# Patient Record
Sex: Male | Born: 1995 | Race: White | Hispanic: No
Health system: Southern US, Community
[De-identification: ages and names within clinical notes are randomized; demographics above are authoritative.]

## PROBLEM LIST (undated history)

## (undated) DIAGNOSIS — B001 Herpesviral vesicular dermatitis: Secondary | ICD-10-CM

## (undated) DIAGNOSIS — R51 Headache: Secondary | ICD-10-CM

## (undated) DIAGNOSIS — R030 Elevated blood-pressure reading, without diagnosis of hypertension: Secondary | ICD-10-CM

## (undated) DIAGNOSIS — R519 Headache, unspecified: Secondary | ICD-10-CM

## (undated) DIAGNOSIS — G43909 Migraine, unspecified, not intractable, without status migrainosus: Secondary | ICD-10-CM

## (undated) DIAGNOSIS — I10 Essential (primary) hypertension: Secondary | ICD-10-CM

## (undated) HISTORY — DX: Herpesviral vesicular dermatitis: B00.1

## (undated) HISTORY — DX: Headache: R51

## (undated) HISTORY — DX: Headache, unspecified: R51.9

## (undated) HISTORY — DX: Migraine, unspecified, not intractable, without status migrainosus: G43.909

## (undated) HISTORY — DX: Elevated blood-pressure reading, without diagnosis of hypertension: R03.0

---
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
## 2015-01-30 ENCOUNTER — Emergency Department: Payer: Self-pay | Admitting: Emergency Medicine

## 2015-05-19 ENCOUNTER — Encounter (INDEPENDENT_AMBULATORY_CARE_PROVIDER_SITE_OTHER): Payer: Self-pay

## 2015-05-19 ENCOUNTER — Ambulatory Visit (INDEPENDENT_AMBULATORY_CARE_PROVIDER_SITE_OTHER): Payer: 59 | Admitting: Primary Care

## 2015-05-19 ENCOUNTER — Encounter: Payer: Self-pay | Admitting: Primary Care

## 2015-05-19 VITALS — BP 128/76 | HR 94 | Temp 98.0°F | Ht 72.0 in | Wt 243.1 lb

## 2015-05-19 DIAGNOSIS — G43001 Migraine without aura, not intractable, with status migrainosus: Secondary | ICD-10-CM

## 2015-05-19 DIAGNOSIS — G43019 Migraine without aura, intractable, without status migrainosus: Secondary | ICD-10-CM | POA: Insufficient documentation

## 2015-05-19 MED ORDER — SUMATRIPTAN SUCCINATE 50 MG PO TABS: ORAL_TABLET | ORAL | Status: DC

## 2015-05-19 MED ORDER — TOPIRAMATE 25 MG PO TABS
25.0000 mg | ORAL_TABLET | Freq: Every day | ORAL | Status: DC
Start: 2015-05-19 — End: 2015-09-13

## 2015-05-19 NOTE — Progress Notes (Signed)
   Subjective:    Patient ID: Timothy Proctor, male    DOB: 02/02/1996, 19 y.o.   MRN: [id number]  HPI  [name] is an 19 year old male who presents today to establish care and discuss the problems mentioned below.   1) Migraines: Headaches present since childhood and will get them daily to every other day with varying intensity. He will experience nausea and photophobia during migraine attacks which occur three times weekly. His migraines will typically occur to his forehead. He will frequently have to call out of work due to headaches. He was once on treatment as a child but cannot remember the medication. He will typically take several Aleve tablets and sleep which will help daily headaches.  Review of Systems  Constitutional: Negative for fatigue and unexpected weight change.  HENT: Negative for rhinorrhea.   Respiratory: Negative for cough and shortness of breath.   Cardiovascular: Negative for chest pain.  Gastrointestinal: Negative for diarrhea and constipation.  Genitourinary: Negative for dysuria and frequency.  Musculoskeletal: Negative for myalgias and arthralgias.  Skin: Negative for rash.  Neurological: Positive for headaches. Negative for dizziness and numbness.  Psychiatric/Behavioral:       Denies concerns for anxiety or depression.       Past Medical History  Diagnosis Date  . Frequent headaches   . Elevated blood pressure (not hypertension)   . Migraine     History   Social History  . Marital Status: Single    Spouse Name: N/A  . Number of Children: N/A  . Years of Education: N/A   Occupational History  . Not on file.   Social History Main Topics  . Smoking status: Former Games developermoker  . Smokeless tobacco: Former NeurosurgeonUser    Quit date: 03/29/2015  . Alcohol Use: No  . Drug Use: Not on file  . Sexual Activity: Not on file   Other Topics Concern  . Not on file   Social History Narrative   Single.   [place] [employer]   Highest level of education  completed was 10th grade.   Enjoys spending time with his girlfriend, playing basketball, exercising.    History reviewed. No pertinent past surgical history.  Family History  Problem Relation Age of Onset  . Hypertension Father   . Cancer Maternal Grandmother     lung  . Hypertension Mother     No Known Allergies  No current outpatient prescriptions on file prior to visit.   No current facility-administered medications on file prior to visit.    BP 128/76 mmHg  Pulse 94  Temp(Src) 98 F (36.7 C) (Oral)  Ht 6' (1.829 m)  Wt 243 lb 1.9 oz (110.279 kg)  BMI 32.97 kg/m2  SpO2 98%    Objective:   Physical Exam  Constitutional: He is oriented to person, place, and time. He appears well-nourished.  HENT:  Head: Normocephalic.  Eyes: EOM are normal. Pupils are equal, round, and reactive to light.  Cardiovascular: Normal rate and regular rhythm.   Pulmonary/Chest: Effort normal and breath sounds normal.  Musculoskeletal: Normal range of motion.  Neurological: He is alert and oriented to person, place, and time. He has normal reflexes. No cranial nerve deficit.  Skin: Skin is warm and dry.          Assessment & Plan:

## 2015-05-19 NOTE — Patient Instructions (Signed)
Start Topamax tablets. Take 1 tablet by mouth at bedtime every day for migraine prevention.  You may use the Sumatriptan as needed for severe migraines. Take 1 tablet at onset of migraine. May take the second tablet if no improvement in migraine in 2 hours. Do not exceed 2 tablets in 24 hours. This medication will make you drowsy.  It was a pleasure to meet you today! Please don't hesitate to call me with any questions. Welcome to [facility]!  Follow up in 3-4 weeks for evaluation of migraines.  Migraine Headache A migraine headache is an intense, throbbing pain on one or both sides of your head. A migraine can last for 30 minutes to several hours. CAUSES  The exact cause of a migraine headache is not always known. However, a migraine may be caused when nerves in the brain become irritated and release chemicals that cause inflammation. This causes pain. Certain things may also trigger migraines, such as:  Alcohol.  Smoking.  Stress.  Menstruation.  Aged cheeses.  Foods or drinks that contain nitrates, glutamate, aspartame, or tyramine.  Lack of sleep.  Chocolate.  Caffeine.  Hunger.  Physical exertion.  Fatigue.  Medicines used to treat chest pain (nitroglycerine), birth control pills, estrogen, and some blood pressure medicines. SIGNS AND SYMPTOMS  Pain on one or both sides of your head.  Pulsating or throbbing pain.  Severe pain that prevents daily activities.  Pain that is aggravated by any physical activity.  Nausea, vomiting, or both.  Dizziness.  Pain with exposure to bright lights, loud noises, or activity.  General sensitivity to bright lights, loud noises, or smells. Before you get a migraine, you may get warning signs that a migraine is coming (aura). An aura may include:  Seeing flashing lights.  Seeing bright spots, halos, or zigzag lines.  Having tunnel vision or blurred vision.  Having feelings of numbness or tingling.  Having trouble  talking.  Having muscle weakness. DIAGNOSIS  A migraine headache is often diagnosed based on:  Symptoms.  Physical exam.  A CT scan or MRI of your head. These imaging tests cannot diagnose migraines, but they can help rule out other causes of headaches. TREATMENT Medicines may be given for pain and nausea. Medicines can also be given to help prevent recurrent migraines.  HOME CARE INSTRUCTIONS  Only take over-the-counter or prescription medicines for pain or discomfort as directed by your health care provider. The use of long-term narcotics is not recommended.  Lie down in a dark, quiet room when you have a migraine.  Keep a journal to find out what may trigger your migraine headaches. For example, write down:  What you eat and drink.  How much sleep you get.  Any change to your diet or medicines.  Limit alcohol consumption.  Quit smoking if you smoke.  Get 7-9 hours of sleep, or as recommended by your health care provider.  Limit stress.  Keep lights dim if bright lights bother you and make your migraines worse. SEEK IMMEDIATE MEDICAL CARE IF:   Your migraine becomes severe.  You have a fever.  You have a stiff neck.  You have vision loss.  You have muscular weakness or loss of muscle control.  You start losing your balance or have trouble walking.  You feel faint or pass out.  You have severe symptoms that are different from your first symptoms. MAKE SURE YOU:   Understand these instructions.  Will watch your condition.  Will get help right away if  you are not doing well or get worse. Document Released: 11/27/2005 Document Revised: 04/13/2014 Document Reviewed: 08/04/2013 [facility] Patient Information 2015 [employer] [place]. This information is not intended to replace advice given to you by your health care provider. Make sure you discuss any questions you have with your health care provider.

## 2015-05-19 NOTE — Progress Notes (Signed)
Pre visit review using our clinic review tool, if applicable. No additional management support is needed unless otherwise documented below in the visit note. 

## 2015-05-19 NOTE — Assessment & Plan Note (Addendum)
Present since childhood. Migraines three times weekly with photophobia and nausea which cause him to call out of work Currently not managed on medication. Neuro exam unremarkable. Start daily Topamax at HS. RX for Imitrex PRN with specific instuctions. Follow up in one month for re-evaluation.

## 2015-06-21 ENCOUNTER — Ambulatory Visit: Payer: 59 | Admitting: Primary Care

## 2015-09-13 ENCOUNTER — Encounter: Payer: Self-pay | Admitting: Primary Care

## 2015-09-13 ENCOUNTER — Ambulatory Visit (INDEPENDENT_AMBULATORY_CARE_PROVIDER_SITE_OTHER): Payer: 59 | Admitting: Primary Care

## 2015-09-13 VITALS — BP 142/92 | HR 80 | Temp 97.8°F | Ht 72.0 in | Wt 242.1 lb

## 2015-09-13 DIAGNOSIS — R519 Headache, unspecified: Secondary | ICD-10-CM | POA: Insufficient documentation

## 2015-09-13 DIAGNOSIS — R51 Headache: Secondary | ICD-10-CM

## 2015-09-13 MED ORDER — PROPRANOLOL HCL ER 80 MG PO CP24: 80.0000 mg | ORAL_CAPSULE | Freq: Every day | ORAL | Status: DC

## 2015-09-13 NOTE — Progress Notes (Signed)
Pre visit review using our clinic review tool, if applicable. No additional management support is needed unless otherwise documented below in the visit note. 

## 2015-09-13 NOTE — Patient Instructions (Signed)
Start propranolol ER tablets for migraines. Take 1 capsule by mouth daily. This will also help to lower your blood pressure and heart rate. Please call me if you develop dizziness and weakness.  Please schedule a follow up appointment in 1 month for re-evaluation of headaches.  It was a pleasure to see you today!

## 2015-09-13 NOTE — Progress Notes (Signed)
Subjective:    Patient ID: Timothy Proctor, male    DOB: [birth year], 19 y.o.   MRN: [id number]  HPI  Mr. Lekas is a 19 year old male who presents today with a chief complaint of headache. He was evaluated as a new patient in June 2016 with a chief complaint of migraine headaches. His headaches have been present since childhood and are located typically to the frontal lobes. During that visit he endorsed migraines three times weekly with photophobia and nausea. He was initiated on Topamax and provided with a script for Imitrex as needed. He was to follow up in 1 month for re-evaluation but never followed up.  Since his last visit, he stopped taking the Topamax 2 weeks after our first visit as his headaches became worse. He continues to get headaches nearly everyday and will have to call out of work due to pain. He will experience photophobia and nausea. His headahces are located circumferentailly around his head. He's been taking tylenol for headaches with temporary relief. BP is elevated in the clinic today at 142/92 and same upon recheck. He reports a history of elevated readings from prior offices.  BP Readings from Last 3 Encounters:  09/13/15 142/92  05/19/15 128/76     Review of Systems  Constitutional: Negative for fever.  Eyes: Positive for photophobia.  Respiratory: Negative for shortness of breath.   Cardiovascular: Negative for chest pain.  Neurological: Positive for headaches. Negative for dizziness.       Past Medical History  Diagnosis Date  . Frequent headaches   . Elevated blood pressure (not hypertension)   . Migraine     Social History   Social History  . Marital Status: Single    Spouse Name: N/A  . Number of Children: N/A  . Years of Education: N/A   Occupational History  . Not on file.   Social History Main Topics  . Smoking status: Former Games developer  . Smokeless tobacco: Former Neurosurgeon    Quit date: 03/29/2015  . Alcohol Use: No  . Drug Use: Not on  file  . Sexual Activity: Not on file   Other Topics Concern  . Not on file   Social History Narrative   Single.   Works at [employer].   Highest level of education completed was 10th grade.   Enjoys spending time with his girlfriend, playing basketball, exercising.    No past surgical history on file.  Family History  Problem Relation Age of Onset  . Hypertension Father   . Cancer Maternal Grandmother     lung  . Hypertension Mother     No Known Allergies  No current outpatient prescriptions on file prior to visit.   No current facility-administered medications on file prior to visit.    BP 142/92 mmHg  Pulse 80  Temp(Src) 97.8 F (36.6 C) (Oral)  Ht 6' (1.829 m)  Wt 242 lb 1.9 oz (109.825 kg)  BMI 32.83 kg/m2  SpO2 98%    Objective:   Physical Exam  Constitutional: He is oriented to person, place, and time. He appears well-nourished.  Eyes: EOM are normal. Pupils are equal, round, and reactive to light.  Cardiovascular: Normal rate and regular rhythm.   Pulmonary/Chest: Effort normal and breath sounds normal.  Neurological: He is alert and oriented to person, place, and time. No cranial nerve deficit.  Skin: Skin is warm and dry.  Psychiatric: He has a normal mood and affect.  Assessment & Plan:

## 2015-09-13 NOTE — Assessment & Plan Note (Signed)
Present since childhood with migraines. Tension headaches with photophobia and nausea. Increased pain with topamax.  Will try low dose beta blocker for both headaches and elevated BP. Discussed potential side effects. Follow up in 1 month for re-evaluation.

## 2015-10-14 ENCOUNTER — Telehealth: Payer: Self-pay

## 2015-10-14 ENCOUNTER — Ambulatory Visit (INDEPENDENT_AMBULATORY_CARE_PROVIDER_SITE_OTHER): Payer: 59 | Admitting: Primary Care

## 2015-10-14 ENCOUNTER — Encounter: Payer: Self-pay | Admitting: Primary Care

## 2015-10-14 VITALS — BP 146/94 | HR 93 | Temp 97.4°F | Ht 72.0 in | Wt 242.0 lb

## 2015-10-14 DIAGNOSIS — R51 Headache: Secondary | ICD-10-CM | POA: Diagnosis not present

## 2015-10-14 DIAGNOSIS — R519 Headache, unspecified: Secondary | ICD-10-CM

## 2015-10-14 NOTE — Patient Instructions (Signed)
Continue taking Propranolol ER 80 mg for headaches. You may take them at night if you experience dizziness. You may also take them every other day as discussed.  Please schedule a follow up appointment in 3 months.  It was a pleasure to see you today!

## 2015-10-14 NOTE — Telephone Encounter (Signed)
Pt left v/m; pt was seen earlier today and wants to know if can get note to be out of work today and 10/15/15; pt said he gets very hot at work and since BP is elevated wants to take 10/14/15 and 10/15/15 off work. Pt request cb.

## 2015-10-14 NOTE — Telephone Encounter (Signed)
We can write him out of work for today due to his appointment, but I will not write him out for tomorrow. Timothy Proctor, will you please complete work note and notify patient?

## 2015-10-14 NOTE — Progress Notes (Signed)
Pre visit review using our clinic review tool, if applicable. No additional management support is needed unless otherwise documented below in the visit note. 

## 2015-10-14 NOTE — Assessment & Plan Note (Signed)
Improved on Propranolol ER 80 mg. No migraines since and has not had to leave work. Exam unremarkable. Continue current regimen.  Follow up in 3 months for re-evaluation.

## 2015-10-14 NOTE — Progress Notes (Signed)
   Subjective:    Patient ID: Timothy Proctor, male    DOB: 10/29/1996, 19 y.o.   MRN: [id number]  HPI  [name] is a 19 year old male who presents today for follow up of frequent headaches.  He was re-evaluated 1 month ago for frequent headaches that he's had since childhood. He described tension headaches with photoohobia and nausea that become debilitating and require him to leave work. He had increased pain with trial of Topamax in June 2016 which he stopped taking and did not follow up as instructed. He was initiated on propranolol ER 80 mg daily last visit.   Since his last visit he's noticed a significant improvement in his headaches. He's had some dizziness which has been intermittent and not bothersome. He's been taking his medication at bedtime to help prevent dizziness. Denies chest pain, changes in vision. He's not missed any work since initiation of Propranolol ER 80 mg and has not had a migraine since.   Review of Systems  Eyes: Negative for visual disturbance.  Cardiovascular: Negative for chest pain.  Neurological: Negative for dizziness and headaches.       Past Medical History  Diagnosis Date  . Frequent headaches   . Elevated blood pressure (not hypertension)   . Migraine     Social History   Social History  . Marital Status: Single    Spouse Name: N/A  . Number of Children: N/A  . Years of Education: N/A   Occupational History  . Not on file.   Social History Main Topics  . Smoking status: Former Games developermoker  . Smokeless tobacco: Former NeurosurgeonUser    Quit date: 03/29/2015  . Alcohol Use: No  . Drug Use: Not on file  . Sexual Activity: Not on file   Other Topics Concern  . Not on file   Social History Narrative   Single.   [place] [employer]   Highest level of education completed was 10th grade.   Enjoys spending time with his girlfriend, playing basketball, exercising.    No past surgical history on file.  Family History  Problem Relation Age  of Onset  . Hypertension Father   . Cancer Maternal Grandmother     lung  . Hypertension Mother     No Known Allergies  Current Outpatient Prescriptions on File Prior to Visit  Medication Sig Dispense Refill  . propranolol ER (INDERAL LA) 80 MG 24 hr capsule Take 1 capsule (80 mg total) by mouth daily. 30 capsule 1   No current facility-administered medications on file prior to visit.    BP 146/94 mmHg  Pulse 93  Temp(Src) 97.4 F (36.3 C) (Oral)  Ht 6' (1.829 m)  Wt 242 lb (109.77 kg)  BMI 32.81 kg/m2  SpO2 98%    Objective:   Physical Exam  Constitutional: He appears well-nourished.  Eyes: Conjunctivae and EOM are normal. Pupils are equal, round, and reactive to light.  Cardiovascular: Normal rate and regular rhythm.   Pulmonary/Chest: Effort normal and breath sounds normal.  Neurological: No cranial nerve deficit.  Skin: Skin is warm and dry.          Assessment & Plan:

## 2015-10-15 NOTE — Telephone Encounter (Signed)
Tried to call patient on 10/14/2015 and again today 10/15/2015 but no respond. Will try again.

## 2015-10-15 NOTE — Telephone Encounter (Signed)
Work note left in front office.

## 2015-10-18 NOTE — Telephone Encounter (Signed)
Message left for patient to return my call on mobile number.

## 2015-10-20 NOTE — Telephone Encounter (Signed)
Pt called for work note status; advised pt note at front desk for pick up. Pt voiced understanding.

## 2015-10-21 ENCOUNTER — Encounter: Payer: Self-pay | Admitting: Primary Care

## 2015-10-21 ENCOUNTER — Ambulatory Visit (INDEPENDENT_AMBULATORY_CARE_PROVIDER_SITE_OTHER): Payer: 59 | Admitting: Primary Care

## 2015-10-21 VITALS — BP 148/96 | HR 92 | Temp 98.2°F | Ht 72.0 in | Wt 241.4 lb

## 2015-10-21 DIAGNOSIS — R51 Headache: Secondary | ICD-10-CM

## 2015-10-21 DIAGNOSIS — G44229 Chronic tension-type headache, not intractable: Secondary | ICD-10-CM | POA: Diagnosis not present

## 2015-10-21 DIAGNOSIS — G43001 Migraine without aura, not intractable, with status migrainosus: Secondary | ICD-10-CM | POA: Diagnosis not present

## 2015-10-21 DIAGNOSIS — R519 Headache, unspecified: Secondary | ICD-10-CM

## 2015-10-21 MED ORDER — KETOROLAC TROMETHAMINE 60 MG/2ML IM SOLN
60.0000 mg | Freq: Once | INTRAMUSCULAR | Status: AC
  Administered 2015-10-21: 60 mg via INTRAMUSCULAR

## 2015-10-21 MED ORDER — PROMETHAZINE HCL 25 MG/ML IJ SOLN
25.0000 mg | Freq: Once | INTRAMUSCULAR | Status: AC
  Administered 2015-10-21: 25 mg via INTRAMUSCULAR

## 2015-10-21 NOTE — Assessment & Plan Note (Signed)
Improved at last visit last week on Propranolol ER 80. Today with migraine that's been present since Tuesday. Treatment provided in the office today. Noted BP has been elevated during last 3 visits.  Due to FH of aneurysms, will order CTA of brain to rule out other causes for headache.

## 2015-10-21 NOTE — Assessment & Plan Note (Signed)
Present today. Started Tuesday with photophobia, now with nausea and vomiting. IM Toradol and Phenergan provided in office today, driver present.

## 2015-10-21 NOTE — Patient Instructions (Addendum)
[facility] by the front [facility]. I will notify you of your results once received.  You've been provided with injections of Toradol (pain) and Phenergan (nausea).  It was a pleasure to see you today!

## 2015-10-21 NOTE — Progress Notes (Signed)
   Subjective:    Patient ID: Timothy Proctor, male    DOB: 09/26/1996, 19 y.o.   MRN: [id number]  HPI  [name] is a 19 year old male who presents today with a chief complaint of headache. He was evluated on 10/14/15 for follow up of chronic headaches and was feeling much improved. His headaches had been few in the last month since initiated on Propranolol ER 80 mg in early October.  He developed a headache on Tuesday this week. He's experiencing sensitivity to light and nausea with vomiting. He's taken his Propranolol ER 80 mg but nothing else over the counter. His headache is located to the frontal lobe bilaterally. He does endorse that his grandmother has a history of aneurysms, unsure of location.    BP Readings from Last 3 Encounters:  10/21/15 148/96  10/14/15 146/94  09/13/15 142/92     Review of Systems  Constitutional: Negative for fever.  Eyes: Positive for photophobia.  Gastrointestinal: Positive for nausea.  Neurological: Positive for headaches. Negative for dizziness.       Past Medical History  Diagnosis Date  . Frequent headaches   . Elevated blood pressure (not hypertension)   . Migraine     Social History   Social History  . Marital Status: Single    Spouse Name: N/A  . Number of Children: N/A  . Years of Education: N/A   Occupational History  . Not on file.   Social History Main Topics  . Smoking status: Former Games developermoker  . Smokeless tobacco: Former NeurosurgeonUser    Quit date: 03/29/2015  . Alcohol Use: No  . Drug Use: Not on file  . Sexual Activity: Not on file   Other Topics Concern  . Not on file   Social History Narrative   Single.   [place] [employer]   Highest level of education completed was 10th grade.   Enjoys spending time with his girlfriend, playing basketball, exercising.    No past surgical history on file.  Family History  Problem Relation Age of Onset  . Hypertension Father   . Cancer Maternal Grandmother     lung  .  Hypertension Mother     No Known Allergies  Current Outpatient Prescriptions on File Prior to Visit  Medication Sig Dispense Refill  . propranolol ER (INDERAL LA) 80 MG 24 hr capsule Take 1 capsule (80 mg total) by mouth daily. 30 capsule 1   No current facility-administered medications on file prior to visit.    BP 148/96 mmHg  Pulse 92  Temp(Src) 98.2 F (36.8 C) (Oral)  Ht 6' (1.829 m)  Wt 241 lb 6.4 oz (109.498 kg)  BMI 32.73 kg/m2  SpO2 97%    Objective:   Physical Exam  Constitutional: He is oriented to person, place, and time. He appears well-nourished.  Eyes: EOM are normal. Pupils are equal, round, and reactive to light.  Cardiovascular: Normal rate and regular rhythm.   Pulmonary/Chest: Effort normal and breath sounds normal.  Neurological: He is alert and oriented to person, place, and time. No cranial nerve deficit.  Skin: Skin is warm and dry.          Assessment & Plan:

## 2015-10-21 NOTE — Progress Notes (Signed)
Pre visit review using our clinic review tool, if applicable. No additional management support is needed unless otherwise documented below in the visit note. 

## 2015-10-25 ENCOUNTER — Ambulatory Visit: Admission: RE | Admit: 2015-10-25 | Payer: 59 | Source: Ambulatory Visit

## 2015-12-08 ENCOUNTER — Encounter: Payer: Self-pay | Admitting: Family Medicine

## 2015-12-08 ENCOUNTER — Ambulatory Visit (INDEPENDENT_AMBULATORY_CARE_PROVIDER_SITE_OTHER): Payer: 59 | Admitting: Family Medicine

## 2015-12-08 VITALS — BP 124/82 | HR 98 | Temp 98.1°F | Ht 72.0 in | Wt 239.5 lb

## 2015-12-08 DIAGNOSIS — A084 Viral intestinal infection, unspecified: Secondary | ICD-10-CM

## 2015-12-08 NOTE — Assessment & Plan Note (Signed)
Since Sunday-now much improved  Work note to return tomorrow Recommend BRAT diet-adv as tolerated  Fluids/ avoid dehydration - doing well with that  Update if not starting to improve in a week or if worsening

## 2015-12-08 NOTE — Patient Instructions (Signed)
Glad you are starting to feel better Keep sipping fluids Stick with bland diet until nausea subsides and diarrhea slows down (BRAT)- bananas/ rice/apple sauce and toast  Advance as tolerated   Update if not starting to improve in a week or if worsening

## 2015-12-08 NOTE — Progress Notes (Signed)
Pre visit review using our clinic review tool, if applicable. No additional management support is needed unless otherwise documented below in the visit note. 

## 2015-12-08 NOTE — Progress Notes (Signed)
Subjective:    Patient ID: Timothy Proctor, male    DOB: [birth year], 19 y.o.   MRN: [id number]  HPI Here with n/v/d  Started on Sunday- with diarrhea - early am  Watery- had constant bms for the first day  Then started vomiting - late Sunday and Monday - 3 times   No blood in stool  Felt hot-thinks he had a fever -but never had aches or chills   Now - feeling a lot better  Still nauseated - but has not vomited  Wiped out  Stools are loose but less frequent   Some abd -sore muscles from vomiting - no focal pain   Drinking water - is able to keep it down   Is eating - PB sandwich   Patient Active Problem List   Diagnosis Date Noted  . Frequent headaches 09/13/2015  . Migraine without aura and with status migrainosus, not intractable 05/19/2015   Past Medical History  Diagnosis Date  . Frequent headaches   . Elevated blood pressure (not hypertension)   . Migraine    No past surgical history on file. Social History  Substance Use Topics  . Smoking status: Former Games developer  . Smokeless tobacco: Former Neurosurgeon    Quit date: 03/29/2015  . Alcohol Use: No   Family History  Problem Relation Age of Onset  . Hypertension Father   . Cancer Maternal Grandmother     lung  . Hypertension Mother    No Known Allergies Current Outpatient Prescriptions on File Prior to Visit  Medication Sig Dispense Refill  . propranolol ER (INDERAL LA) 80 MG 24 hr capsule Take 1 capsule (80 mg total) by mouth daily. 30 capsule 1   No current facility-administered medications on file prior to visit.     Review of Systems Review of Systems  Constitutional: Negative for fever, appetite change, and unexpected weight change.  Eyes: Negative for pain and visual disturbance.  Respiratory: Negative for cough and shortness of breath.   Cardiovascular: Negative for cp or palpitations    Gastrointestinal: Negative for constipation. Neg for abd pain or blood in stool Genitourinary: Negative for  urgency and frequency.  Skin: Negative for pallor or rash   Neurological: Negative for weakness, light-headedness, numbness and headaches.  Hematological: Negative for adenopathy. Does not bruise/bleed easily.  Psychiatric/Behavioral: Negative for dysphoric mood. The patient is not nervous/anxious.         Objective:   Physical Exam  Constitutional: He appears well-developed and well-nourished. No distress.  obese and well appearing   HENT:  Head: Normocephalic and atraumatic.  Mouth/Throat: Oropharynx is clear and moist.  Eyes: Conjunctivae and EOM are normal. Pupils are equal, round, and reactive to light. No scleral icterus.  Neck: Normal range of motion. Neck supple.  Cardiovascular: Normal rate, regular rhythm and normal heart sounds.   Pulmonary/Chest: Effort normal and breath sounds normal. No respiratory distress. He has no wheezes. He has no rales.  Abdominal: Soft. Bowel sounds are normal. He exhibits no distension, no abdominal bruit and no mass. There is no hepatosplenomegaly. There is no tenderness. There is no rebound, no guarding and no CVA tenderness.  Lymphadenopathy:    He has no cervical adenopathy.  Neurological: He is alert.  Skin: Skin is warm and dry. No erythema. No pallor.  Nl skin color and turgor   Psychiatric: He has a normal mood and affect.          Assessment & Plan:   Problem List  Items Addressed This Visit      Digestive   Viral gastroenteritis - Primary    Since Sunday-now much improved  Work note to return tomorrow Recommend BRAT diet-adv as tolerated  Fluids/ avoid dehydration - doing well with that  Update if not starting to improve in a week or if worsening

## 2016-01-12 ENCOUNTER — Telehealth: Payer: Self-pay | Admitting: Primary Care

## 2016-01-12 ENCOUNTER — Ambulatory Visit: Payer: 59 | Admitting: Primary Care

## 2016-01-12 NOTE — Telephone Encounter (Signed)
Noted. Please do not charge no show fee. 

## 2016-01-12 NOTE — Telephone Encounter (Signed)
Will you please call Timothy Proctor and notify him that he missed an appointment today. He also never got the CT scan of his head that I ordered. Is there a reason he did not get the CT of his head? How are his headaches?

## 2016-01-12 NOTE — Telephone Encounter (Signed)
Called and spoken to patient. Patient stated that he did not realize, he had an appointment today and he has been at work all day. Asked him why he did not go for the CT scan and patient stated that he really could not afford it so he didn't go. His headaches are okay at the moment. He said if he needs anything, he will let us know.
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
## 2016-02-08 ENCOUNTER — Emergency Department
Admission: EM | Admit: 2016-02-08 | Discharge: 2016-02-08 | Disposition: A | Discharge status: Home / Self Care | Payer: 59 | Attending: Emergency Medicine | Admitting: Emergency Medicine

## 2016-02-08 ENCOUNTER — Encounter: Payer: Self-pay | Admitting: Emergency Medicine

## 2016-02-08 DIAGNOSIS — Z79899 Other long term (current) drug therapy: Secondary | ICD-10-CM | POA: Insufficient documentation

## 2016-02-08 DIAGNOSIS — G43909 Migraine, unspecified, not intractable, without status migrainosus: Secondary | ICD-10-CM | POA: Diagnosis present

## 2016-02-08 DIAGNOSIS — R040 Epistaxis: Secondary | ICD-10-CM | POA: Diagnosis not present

## 2016-02-08 DIAGNOSIS — I10 Essential (primary) hypertension: Secondary | ICD-10-CM | POA: Insufficient documentation

## 2016-02-08 DIAGNOSIS — G43709 Chronic migraine without aura, not intractable, without status migrainosus: Secondary | ICD-10-CM | POA: Diagnosis not present

## 2016-02-08 DIAGNOSIS — R234 Changes in skin texture: Secondary | ICD-10-CM | POA: Insufficient documentation

## 2016-02-08 DIAGNOSIS — Z87891 Personal history of nicotine dependence: Secondary | ICD-10-CM | POA: Insufficient documentation

## 2016-02-08 HISTORY — DX: Essential (primary) hypertension: I10

## 2016-02-08 MED ORDER — KETOROLAC TROMETHAMINE 30 MG/ML IJ SOLN
60.0000 mg | Freq: Once | INTRAMUSCULAR | Status: AC
  Administered 2016-02-08: 60 mg via INTRAMUSCULAR
  Filled 2016-02-08: qty 2

## 2016-02-08 MED ORDER — PROMETHAZINE HCL 25 MG/ML IJ SOLN
25.0000 mg | Freq: Once | INTRAMUSCULAR | Status: AC
Start: 2016-02-08 — End: 2016-02-08
  Administered 2016-02-08: 25 mg via INTRAMUSCULAR
  Filled 2016-02-08: qty 1

## 2016-02-08 MED ORDER — DIPHENHYDRAMINE HCL 50 MG/ML IJ SOLN
50.0000 mg | Freq: Once | INTRAMUSCULAR | Status: AC
  Administered 2016-02-08: 50 mg via INTRAMUSCULAR
  Filled 2016-02-08: qty 1

## 2016-02-08 MED ORDER — DEXAMETHASONE SODIUM PHOSPHATE 10 MG/ML IJ SOLN
10.0000 mg | Freq: Once | INTRAMUSCULAR | Status: AC
  Administered 2016-02-08: 10 mg via INTRAMUSCULAR
  Filled 2016-02-08: qty 1

## 2016-02-08 NOTE — Discharge Instructions (Signed)
Recurrent Migraine Headache A migraine headache is an intense, throbbing pain on one or both sides of your head. Recurrent migraines keep coming back. A migraine can last for 30 minutes to several hours. CAUSES  The exact cause of a migraine headache is not always known. However, a migraine may be caused when nerves in the brain become irritated and release chemicals that cause inflammation. This causes pain. Certain things may also trigger migraines, such as:   Alcohol.  Smoking.  Stress.  Menstruation.  Aged cheeses.  Foods or drinks that contain nitrates, glutamate, aspartame, or tyramine.  Lack of sleep.  Chocolate.  Caffeine.  Hunger.  Physical exertion.  Fatigue.  Medicines used to treat chest pain (nitroglycerine), birth control pills, estrogen, and some blood pressure medicines. SYMPTOMS   Pain on one or both sides of your head.  Pulsating or throbbing pain.  Severe pain that prevents daily activities.  Pain that is aggravated by any physical activity.  Nausea, vomiting, or both.  Dizziness.  Pain with exposure to bright lights, loud noises, or activity.  General sensitivity to bright lights, loud noises, or smells. Before you get a migraine, you may get warning signs that a migraine is coming (aura). An aura may include:  Seeing flashing lights.  Seeing bright spots, halos, or zigzag lines.  Having tunnel vision or blurred vision.  Having feelings of numbness or tingling.  Having trouble talking.  Having muscle weakness. DIAGNOSIS  A recurrent migraine headache is often diagnosed based on:  Symptoms.  Physical examination.  A CT scan or MRI of your head. These imaging tests cannot diagnose migraines but can help rule out other causes of headaches.  TREATMENT  Medicines may be given for pain and nausea. Medicines can also be given to help prevent recurrent migraines. HOME CARE INSTRUCTIONS  Only take over-the-counter or prescription  medicines for pain or discomfort as directed by your health care provider. The use of long-term narcotics is not recommended.  Lie down in a dark, quiet room when you have a migraine.  Keep a journal to find out what may trigger your migraine headaches. For example, write down:  What you eat and drink.  How much sleep you get.  Any change to your diet or medicines.  Limit alcohol consumption.  Quit smoking if you smoke.  Get 7-9 hours of sleep, or as recommended by your health care provider.  Limit stress.  Keep lights dim if bright lights bother you and make your migraines worse. SEEK MEDICAL CARE IF:   You do not get relief from the medicines given to you.  You have a recurrence of pain.  You have a fever. SEEK IMMEDIATE MEDICAL CARE IF:  Your migraine becomes severe.  You have a stiff neck.  You have loss of vision.  You have muscular weakness or loss of muscle control.  You start losing your balance or have trouble walking.  You feel faint or pass out.  You have severe symptoms that are different from your first symptoms. MAKE SURE YOU:   Understand these instructions.  Will watch your condition.  Will get help right away if you are not doing well or get worse.   This information is not intended to replace advice given to you by your health care provider. Make sure you discuss any questions you have with your health care provider.   Document Released: 08/22/2001 Document Revised: 12/18/2014 Document Reviewed: 08/04/2013 Elsevier Interactive Patient Education 2016 [employer].  Nosebleed Nosebleeds are  common. They are due to a crack in the inside lining of your nose (mucous membrane) or from a small blood vessel that starts to bleed. Nosebleeds can be caused by many conditions, such as injury, infections, dry mucous membranes or dry climate, medicines, nose picking, and home heating and cooling systems. Most nosebleeds come from blood vessels in the  front of your nose. HOME CARE INSTRUCTIONS   Try controlling your nosebleed by pinching your nostrils gently and continuously for at least 10 minutes.  Avoid blowing or sniffing your nose for a number of hours after having a nosebleed.  Do not put gauze inside your nose yourself. If your nose was packed by your health care provider, try to maintain the pack inside of your nose until your health care provider removes it.  If a gauze pack was used and it starts to fall out, gently replace it or cut off the end of it.  If a balloon catheter was used to pack your nose, do not cut or remove it unless your health care provider has instructed you to do that.  Avoid lying down while you are having a nosebleed. Sit up and lean forward.  Use a nasal spray decongestant to help with a nosebleed as directed by your health care provider.  Do not use petroleum jelly or mineral oil in your nose. These can drip into your lungs.  Maintain humidity in your home by using less air conditioning or by using a humidifier.  Aspirinand blood thinners make bleeding more likely. If you are prescribed these medicines and you suffer from nosebleeds, ask your health care provider if you should stop taking the medicines or adjust the dose. Do not stop medicines unless directed by your health care provider  Resume your normal activities as you are able, but avoid straining, lifting, or bending at the waist for several days.  If your nosebleed was caused by dry mucous membranes, use over-the-counter saline nasal spray or gel. This will keep the mucous membranes moist and allow them to heal. If you must use a lubricant, choose the water-soluble variety. Use it only sparingly, and do not use it within several hours of lying down.  Keep all follow-up visits as directed by your health care provider. This is important. SEEK MEDICAL CARE IF:  You have a fever.  You get frequent nosebleeds.  You are getting nosebleeds  more often. SEEK IMMEDIATE MEDICAL CARE IF:  Your nosebleed lasts longer than 20 minutes.  Your nosebleed occurs after an injury to your face, and your nose looks crooked or broken.  You have unusual bleeding from other parts of your body.  You have unusual bruising on other parts of your body.  You feel light-headed or you faint.  You become sweaty.  You vomit blood.  Your nosebleed occurs after a head injury.   This information is not intended to replace advice given to you by your health care provider. Make sure you discuss any questions you have with your health care provider.   Document Released: 09/06/2005 Document Revised: 12/18/2014 Document Reviewed: 07/13/2014 Elsevier Interactive Patient Education [employer]

## 2016-02-08 NOTE — ED Provider Notes (Signed)
[facility] Emergency Department Provider Note  ____________________________________________  Time seen: Approximately 8:38 AM  I have reviewed the triage vital signs and the nursing notes.   HISTORY  Chief Complaint Migraine and Emesis    HPI Timothy Proctor is a 20 y.o. male who presents emergency department complaining of a frontal headache since last night. Patient has a history of migraines and states that presentation is similar to previous migraines. Patient does have nausea and vomiting as well as mild photosensitivity. Patient denies any trauma precipitating these symptoms. Patient states that he vomited approximately one hour prior to arrival. At this time he experienced epistaxis that was able to be stopped with direct pressure. Patient has been on multiple medications for migraine prevention but states that he is stopped taking these. Patient denies any fevers or chills, neck pain, chest pain, shortness of breath, abdominal pain.   Past Medical History  Diagnosis Date  . Frequent headaches   . Elevated blood pressure (not hypertension)   . Migraine   . Hypertension     Patient Active Problem List   Diagnosis Date Noted  . Viral gastroenteritis 12/08/2015  . Frequent headaches 09/13/2015  . Migraine without aura and with status migrainosus, not intractable 05/19/2015    History reviewed. No pertinent past surgical history.  Current Outpatient Rx  Name  Route  Sig  Dispense  Refill  . propranolol ER (INDERAL LA) 80 MG 24 hr capsule   Oral   Take 1 capsule (80 mg total) by mouth daily.   30 capsule   1     Allergies Review of patient's allergies indicates no known allergies.  Family History  Problem Relation Age of Onset  . Hypertension Father   . Cancer Maternal Grandmother     lung  . Hypertension Mother     Social History Social History  Substance Use Topics  . Smoking status: Former Games developer  . Smokeless tobacco: Former  Neurosurgeon    Quit date: 03/29/2015  . Alcohol Use: No     Review of Systems  Constitutional: No fever/chills Eyes: No visual changes.  ENT: No sore throat. One episode of epistaxis. Cardiovascular: no chest pain. Respiratory: no cough. No SOB. Gastrointestinal: No abdominal pain.  No nausea, no vomiting.  No diarrhea.  No constipation. Genitourinary: Negative for dysuria. No hematuria Musculoskeletal: Negative for back pain. Skin: Negative for rash. Neurological: Negative for headaches, focal weakness or numbness. 10-point ROS otherwise negative.  ____________________________________________   PHYSICAL EXAM:  VITAL SIGNS: ED Triage Vitals  Enc Vitals Group     BP --      Pulse Rate 02/08/16 0808 67     Resp 02/08/16 0808 18     Temp 02/08/16 0808 97.4 F (36.3 C)     Temp src --      SpO2 02/08/16 0808 99 %     Weight 02/08/16 0807 235 lb (106.595 kg)     Height 02/08/16 0807 6' (1.829 m)     Head Cir --      Peak Flow --      Pain Score 02/08/16 0807 8     Pain Loc --      Pain Edu? --      Excl. in GC? --      Constitutional: Alert and oriented. Well appearing and in no acute distress. Eyes: Conjunctivae are normal. PERRL. EOMI. Head: Atraumatic. ENT:      Ears:       Nose: No  congestion/rhinnorhea. Minor scabbing over the Kiesselbach plexuses noted. No epistaxis at this time.      Mouth/Throat: Mucous membranes are moist. Pharynx is nonerythematous and nonedematous. Neck: No stridor.   Cardiovascular: Normal rate, regular rhythm. Normal S1 and S2.  Good peripheral circulation. Respiratory: Normal respiratory effort without tachypnea or retractions. Lungs CTAB. Gastrointestinal: Soft and nontender to palpation. Bowel sounds 4 quadrants. No guarding or rigidity.. No distention. No CVA tenderness. Neurologic:  Normal speech and language. No gross focal neurologic deficits are appreciated. Cranial nerves II through XII are grossly intact.  Skin:  Skin is warm, dry  and intact. No rash noted. Psychiatric: Mood and affect are normal. Speech and behavior are normal. Patient exhibits appropriate insight and judgement.   ____________________________________________   LABS (all labs ordered are listed, but only abnormal results are displayed)  Labs Reviewed - No data to display ____________________________________________  EKG   ____________________________________________  RADIOLOGY   No results found.  ____________________________________________    PROCEDURES  Procedure(s) performed:       Medications  ketorolac (TORADOL) 30 MG/ML injection 60 mg (not administered)  diphenhydrAMINE (BENADRYL) injection 50 mg (not administered)  promethazine (PHENERGAN) injection 25 mg (not administered)  dexamethasone (DECADRON) injection 10 mg (not administered)     ____________________________________________   INITIAL IMPRESSION / ASSESSMENT AND PLAN / ED COURSE  Pertinent labs & imaging results that were available during my care of the patient were reviewed by me and considered in my medical decision making (see chart for details).  Patient's diagnosis is consistent with migraine headache with nausea and vomiting. Patient had an episode of epistaxis that has stopped with direct pressure. There is scabbing noted over the Kiesselbach plexus. Exam is reassuring and no imaging is undertaken at this time.. Patient is given IM injections here in the emergency department for migraine cessation. Patient is tried multiple prescriptions for migraine relief from his primary care provider and will follow up with primary care for any further treatment.. Patient is given ED precautions to return to the ED for any worsening or new symptoms.     ____________________________________________  FINAL CLINICAL IMPRESSION(S) / ED DIAGNOSES  Final diagnoses:  Chronic migraine without aura without status migrainosus, not intractable  Epistaxis       NEW MEDICATIONS STARTED DURING THIS VISIT:  New Prescriptions   No medications on file        Racheal Patches, PA-C 02/08/16 0849  Myrna Blazer, MD 02/08/16 [phone]

## 2016-02-08 NOTE — ED Notes (Signed)
States he developed frontal headache last pm  Positive n/v and photosensitivity.Marland Kitchendenies any fever trauma or diarrhea  Last time vomited was about 745 am  Sipping on gatorade at present.  States his PCP placed him on Inderal for his headaches but he stopped taking   Stating it doesn't work

## 2016-02-08 NOTE — ED Notes (Signed)
Pt states hx of migraines, has had a headache since last night, vomiting this am, drinking gatorade in triage, states he has not taken anything for his head yet. Nose bleed this am as well.

## 2016-02-21 ENCOUNTER — Encounter: Payer: Self-pay | Admitting: Nurse Practitioner

## 2016-02-21 ENCOUNTER — Ambulatory Visit (INDEPENDENT_AMBULATORY_CARE_PROVIDER_SITE_OTHER): Payer: 59 | Admitting: Nurse Practitioner

## 2016-02-21 VITALS — BP 138/80 | HR 86 | Temp 98.1°F | Resp 14 | Ht 72.0 in | Wt 230.1 lb

## 2016-02-21 DIAGNOSIS — J069 Acute upper respiratory infection, unspecified: Secondary | ICD-10-CM | POA: Diagnosis not present

## 2016-02-21 DIAGNOSIS — B9789 Other viral agents as the cause of diseases classified elsewhere: Principal | ICD-10-CM

## 2016-02-21 MED ORDER — ALBUTEROL SULFATE HFA 108 (90 BASE) MCG/ACT IN AERS: 2.0000 | INHALATION_SPRAY | Freq: Four times a day (QID) | RESPIRATORY_TRACT | Status: DC | PRN

## 2016-02-21 NOTE — Progress Notes (Signed)
Pre visit review using our clinic review tool, if applicable. No additional management support is needed unless otherwise documented below in the visit note. 

## 2016-02-21 NOTE — Progress Notes (Signed)
Patient ID: DAXEN LANUM, male    DOB: Mar 31, 1996  Age: 20 y.o. MRN: [id number]  CC: Cough   HPI SARON TWEED presents for cough x 4 days.   1) Started Friday with dry throat and felt hot Temperature changes  Cough- productive intermittently  Tmax- 99.0  Treatment to date:  Dayquil without relief Sick contacts- denies    History Lazaro has a past medical history of Frequent headaches; Elevated blood pressure (not hypertension); Migraine; and Hypertension.   He has no past surgical history on file.   His family history includes Cancer in his maternal grandmother; Hypertension in his father and mother.He reports that he has quit smoking. He quit smokeless tobacco use about 10 months ago. He reports that he does not drink alcohol. His drug history is not on file.  Outpatient Prescriptions Prior to Visit  Medication Sig Dispense Refill  . propranolol ER (INDERAL LA) 80 MG 24 hr capsule Take 1 capsule (80 mg total) by mouth daily. 30 capsule 1   No facility-administered medications prior to visit.    ROS Review of Systems  Constitutional: Positive for diaphoresis. Negative for fever, chills and fatigue.  HENT: Positive for sore throat. Negative for congestion, ear discharge, ear pain, postnasal drip, rhinorrhea, sinus pressure, sneezing, tinnitus, trouble swallowing and voice change.   Eyes: Negative for visual disturbance.  Respiratory: Positive for cough. Negative for chest tightness, shortness of breath and wheezing.   Cardiovascular: Negative for chest pain, palpitations and leg swelling.  Gastrointestinal: Negative for nausea, vomiting and diarrhea.  Skin: Negative for rash.  Neurological: Negative for dizziness, weakness and numbness.  Psychiatric/Behavioral: The patient is not nervous/anxious.     Objective:  BP 138/80 mmHg  Pulse 86  Temp(Src) 98.1 F (36.7 C) (Oral)  Resp 14  Ht 6' (1.829 m)  Wt 230 lb 1.9 oz (104.382 kg)  BMI 31.20 kg/m2  SpO2  98%  Physical Exam  Constitutional: He is oriented to person, place, and time. He appears well-developed and well-nourished. No distress.  HENT:  Head: Normocephalic and atraumatic.  Right Ear: External ear normal.  Left Ear: External ear normal.  Mouth/Throat: Oropharynx is clear and moist. No oropharyngeal exudate.  TMs clear bilaterally  Eyes: EOM are normal. Pupils are equal, round, and reactive to light. Right eye exhibits no discharge. Left eye exhibits no discharge. No scleral icterus.  Neck: Normal range of motion. Neck supple.  Cardiovascular: Normal rate and regular rhythm.   Pulmonary/Chest: Effort normal. No respiratory distress. He has wheezes. He has no rales. He exhibits no tenderness.  Lymphadenopathy:    He has no cervical adenopathy.  Neurological: He is alert and oriented to person, place, and time.  Skin: Skin is warm and dry. No rash noted. He is not diaphoretic.  Psychiatric: He has a normal mood and affect. His behavior is normal. Judgment and thought content normal.   Assessment & Plan:   Nickolaos was seen today for cough.  Diagnoses and all orders for this visit:  Viral URI with cough  Other orders -     albuterol (PROVENTIL HFA;VENTOLIN HFA) 108 (90 Base) MCG/ACT inhaler; Inhale 2 puffs into the lungs every 6 (six) hours as needed for wheezing or shortness of breath.  I am having Mr. Ibsen start on albuterol. I am also having him maintain his propranolol ER.  Meds ordered this encounter  Medications  . albuterol (PROVENTIL HFA;VENTOLIN HFA) 108 (90 Base) MCG/ACT inhaler    Sig: Inhale 2 puffs  into the lungs every 6 (six) hours as needed for wheezing or shortness of breath.    Dispense:  1 Inhaler    Refill:  2    Order Specific Question:  Supervising Provider    Answer:  Sherlene ShamsULLO, TERESA L [[id number]]     Follow-up: Return if symptoms worsen or fail to improve.

## 2016-02-21 NOTE — Patient Instructions (Signed)
Mucinex over the counter- they have the cough/cold versions that are really good.   Adenovirus Adenoviruses are common viruses that cause many different types of infections. The common cold (upper respiratory infection) is the most common type of infection from an adenovirus. Adenoviruses can also infect your digestive system, eyes, lungs, and bladder. An adenovirus spreads easily from person to person. This is especially true if you are in close contact with someone who is sick. You can breathe in the virus after a sick person coughs or sneezes. Adenoviruses can live outside the body for many weeks. Therefore, you can also get sick after touching an object the virus is living on and then touching your nose or mouth. Adenovirus infections are usually not serious unless you have another health problem that makes it hard for you to fight off the infection.  CAUSES  There are more than 50 types of adenoviruses that can cause infections in humans. Different types of adenoviruses cause different types of infection.  RISK FACTORS The risk for an adenovirus infection is higher for:  People who spend a lot of time in places where there are lots of other people. This includes schools, summer camps, and day care centers.  Babies.  Elderly people.  People with a weak body defense system (immune system).  People with heart or lung disease. SIGNS AND SYMPTOMS  It can take 2-14 days to develop symptoms after the virus gets into your body. Symptoms may include:  Fever.  Sore throat.  Ear pain or fullness.  Nasal congestion.  Cough.  Difficulty breathing.  Stomachache.  Diarrhea.  Pain while passing urine.  Blood in the urine.  Pinkeye (conjunctivitis). DIAGNOSIS  Your health care provider may diagnose an adenovirus infection from your signs and symptoms. A physical exam will also be done. You may have tests to make sure your symptoms are not caused by another type of problem. These can  include a blood test, throat culture, or chest X-ray. TREATMENT  There is no treatment for an adenovirus infection. These infections usually clear up on their own with home care. Your health care provider may recommend over-the-counter medicine to help relieve a sore throat, fever, or headache. HOME CARE INSTRUCTIONS  Rest at home until your symptoms go away.  Drink enough fluid to keep your urine clear or pale yellow.  Take medicines only as directed by your health care provider. PREVENTION   Wash your hands often with soap and water.  Avoid close contact with people who are sick.  Do not go to school or work when you are sick.  Cover your nose or mouth when you sneeze or cough. SEEK MEDICAL CARE IF: Your symptoms of adenovirus infection do not clear up or are getting worse after several days. SEEK IMMEDIATE MEDICAL CARE IF: You have trouble breathing. MAKE SURE YOU:  Understand these instructions.  Will watch your condition.  Will get help right away if you are not doing well or get worse.   This information is not intended to replace advice given to you by your health care provider. Make sure you discuss any questions you have with your health care provider.   Document Released: 02/17/2003 Document Revised: 12/18/2014 Document Reviewed: 04/01/2014 Elsevier Interactive Patient Education [employer]

## 2016-02-26 DIAGNOSIS — J069 Acute upper respiratory infection, unspecified: Secondary | ICD-10-CM | POA: Insufficient documentation

## 2016-02-26 DIAGNOSIS — B9789 Other viral agents as the cause of diseases classified elsewhere: Principal | ICD-10-CM

## 2016-02-26 NOTE — Assessment & Plan Note (Signed)
New onset Will treat conservatively due to probable viral nature Albuterol inhaler called into pharmacy for wheezing OTC measures such as Mucinex plain and Delsym were suggested FU prn worsening/failure to improve.

## 2016-03-20 ENCOUNTER — Ambulatory Visit (INDEPENDENT_AMBULATORY_CARE_PROVIDER_SITE_OTHER): Payer: 59 | Admitting: Primary Care

## 2016-03-20 ENCOUNTER — Encounter: Payer: Self-pay | Admitting: Primary Care

## 2016-03-20 VITALS — BP 142/102 | HR 90 | Temp 97.9°F | Ht 72.0 in | Wt 231.0 lb

## 2016-03-20 DIAGNOSIS — G4700 Insomnia, unspecified: Secondary | ICD-10-CM | POA: Diagnosis not present

## 2016-03-20 DIAGNOSIS — R51 Headache: Secondary | ICD-10-CM

## 2016-03-20 DIAGNOSIS — G43901 Migraine, unspecified, not intractable, with status migrainosus: Secondary | ICD-10-CM

## 2016-03-20 DIAGNOSIS — G43001 Migraine without aura, not intractable, with status migrainosus: Secondary | ICD-10-CM | POA: Diagnosis not present

## 2016-03-20 DIAGNOSIS — R519 Headache, unspecified: Secondary | ICD-10-CM

## 2016-03-20 MED ORDER — AMITRIPTYLINE HCL 25 MG PO TABS: 25.0000 mg | ORAL_TABLET | Freq: Every day | ORAL | Status: DC

## 2016-03-20 MED ORDER — SUMATRIPTAN SUCCINATE 50 MG PO TABS: ORAL_TABLET | ORAL | Status: DC

## 2016-03-20 MED ORDER — METHYLPREDNISOLONE ACETATE 80 MG/ML IJ SUSP
80.0000 mg | Freq: Once | INTRAMUSCULAR | Status: AC
  Administered 2016-03-20: 80 mg via INTRAMUSCULAR

## 2016-03-20 NOTE — Progress Notes (Signed)
Pre visit review using our clinic review tool, if applicable. No additional management support is needed unless otherwise documented below in the visit note. 

## 2016-03-20 NOTE — Progress Notes (Signed)
Subjective:    Patient ID: Timothy Proctor, male    DOB: [birth year], 20 y.o.   MRN: [id number]  HPI  Mr. Goheen is a 20 year old male who presents today with multiple complaints.  1) Rash: Located to bilateral hands and left upper extremity since yesterday. His rash is itchy in nature. He was replacing a roof yesterday and encoutered fiber glass. He's not applied anything OTC for his symptoms except for lotion. No one else in his household is itching.   2) Headaches: Chronic for years. He was prescribed Propranolol LA 80 mg in November 2016 for which he endorsed improvement on subsequent visits. He has a family history of brain aneurysm (maternal grandmother) for which a CTA scan of brain was set up several visits ago due to persistent headaches. He has not yet completed this CT scan. He has failed treatment with Topamax in June 2016 which caused an increase in pain.   He's not taken his propranolol in several months. His headaches are present twice weekly (less frequently) but have increased in intensity. His headaches are mainily located to the right temporal region. He also reports nausea and photosensivity. He will experience migraines three times monthly on average. He does not take anything OTC for his headaches unless they are severe.   Denies changes in vision, dizziness, chest pain.   3) Insomnia: Chronic for years. He has difficulty falling asleep. He's tried Melatonin in the past which helps to get him to sleep, but he will wake up with a headache. He once tried a medication that his family member provided, but cannot remember the same. He endorses snoring, denies periods of waking in the middle of the night. Epsworth Sleepiness Scale score of 5 today.   4) Elevated BP reading: Noted on numerous visits in the past, elevated today. Improved when being treated in the past with Propranolol. Strong FH of hypertension. Will continue to monitor. Could very well be causing headaches.    Review of Systems  Eyes: Negative for visual disturbance.  Respiratory: Negative for shortness of breath.   Cardiovascular: Negative for chest pain.  Neurological: Positive for headaches. Negative for dizziness.  Psychiatric/Behavioral: Positive for sleep disturbance. The patient is not nervous/anxious.        Past Medical History  Diagnosis Date  . Frequent headaches   . Elevated blood pressure (not hypertension)   . Migraine   . Hypertension     Social History   Social History  . Marital Status: Single    Spouse Name: N/A  . Number of Children: N/A  . Years of Education: N/A   Occupational History  . Not on file.   Social History Main Topics  . Smoking status: Former Games developer  . Smokeless tobacco: Former Neurosurgeon    Quit date: 03/29/2015  . Alcohol Use: No  . Drug Use: Not on file  . Sexual Activity: Not on file   Other Topics Concern  . Not on file   Social History Narrative   Single.   Works at [employer].   Highest level of education completed was 10th grade.   Enjoys spending time with his girlfriend, playing basketball, exercising.    No past surgical history on file.  Family History  Problem Relation Age of Onset  . Hypertension Father   . Cancer Maternal Grandmother     lung  . Hypertension Mother     No Known Allergies  Current Outpatient Prescriptions on File Prior to Visit  Medication  Sig Dispense Refill  . albuterol (PROVENTIL HFA;VENTOLIN HFA) 108 (90 Base) MCG/ACT inhaler Inhale 2 puffs into the lungs every 6 (six) hours as needed for wheezing or shortness of breath. (Patient not taking: Reported on 03/20/2016) 1 Inhaler 2  . propranolol ER (INDERAL LA) 80 MG 24 hr capsule Take 1 capsule (80 mg total) by mouth daily. (Patient not taking: Reported on 03/20/2016) 30 capsule 1   No current facility-administered medications on file prior to visit.    BP 142/102 mmHg  Pulse 90  Temp(Src) 97.9 F (36.6 C) (Oral)  Ht 6' (1.829 m)  Wt 231 lb  (104.781 kg)  BMI 31.32 kg/m2  SpO2 98%    Objective:   Physical Exam  Constitutional: He appears well-nourished.  Eyes: EOM are normal. Pupils are equal, round, and reactive to light.  Cardiovascular: Normal rate and regular rhythm.   Pulmonary/Chest: Effort normal and breath sounds normal.  Neurological: No cranial nerve deficit.  Skin: Skin is warm and dry.  Small papular rash to bilateral hands and to left posterior forearm representative of contact dermatitis. Intact, does not appear to be scabies related.  Psychiatric: He has a normal mood and affect.          Assessment & Plan:  Rash:  Papular rash to bilateral hands and left posterior forearm x 24 hours. In contact with fiberglass from applying a roof yesterday. Does not appear to be scabies. Suspect contact dermatitis from encounter with fiberglass. IM Depo Medrol provided today. May use OTC hydrocortisone cream BID. Follow up PRN.

## 2016-03-20 NOTE — Assessment & Plan Note (Signed)
Occurs twice weekly, however, increase in intensity. Not taking propranolol as prescribed as he endorsed it never helped although he did mention improvement on previous visits. He does have several visits with elevated BP reading. Unsure if headaches are causing rises in BP, or if rises in BP are causing headaches.  Will treat with low dose amitriptyline for headache prevention and insomnia. If no improvement, then will initiate treatment for BP. Strong FH of HTN and migraines.  He is to follow up in 6 weeks for re-evaluation.

## 2016-03-20 NOTE — Assessment & Plan Note (Signed)
Long history of, Melatonin caused headaches. Treating chronic headaches with low dose amitriptyline and am hoping this will help with sleep. Follow up in 6 weeks.

## 2016-03-20 NOTE — Assessment & Plan Note (Signed)
Occuring three times monthly. Has no abortive treatment as of now. RX for Imitrex to use PRN sent to pharmacy.

## 2016-03-20 NOTE — Patient Instructions (Addendum)
Start Amitriptyline 25 mg for chronic headaches and difficulty sleeping. Take 1 tablet by mouth every night at bedtime.  I've sent a prescription for Imitrex (sumatriptain) 50 mg tablets to help abort migraines. Take 1 tablet by mouth at onset of bad migraine, may repeat in 2 hours if no improvement. Do not exceed 2 tablets in 24 hours.  Please notify me if you've not experienced any improvement in your rash in 2-3 days. You may apply hydrocortisone 1% cream twice daily for itching. This may be purchased over the counter.  Follow up in 6 weeks for re-evaluation of headaches.   It was a pleasure to see you today!

## 2016-05-09 ENCOUNTER — Ambulatory Visit (INDEPENDENT_AMBULATORY_CARE_PROVIDER_SITE_OTHER): Payer: 59 | Admitting: Family

## 2016-05-09 ENCOUNTER — Encounter: Payer: Self-pay | Admitting: Family

## 2016-05-09 VITALS — BP 122/88 | HR 89 | Temp 98.6°F | Ht 72.0 in | Wt 229.4 lb

## 2016-05-09 DIAGNOSIS — H5711 Ocular pain, right eye: Secondary | ICD-10-CM

## 2016-05-09 NOTE — Patient Instructions (Signed)
Urgent referral to ophthalmology.  If there is no improvement in your symptoms, or if there is any worsening of symptoms, or if you have any additional concerns, please return for re-evaluation; or, if we are closed, consider going to the Emergency Room for evaluation if symptoms urgent.

## 2016-05-09 NOTE — Progress Notes (Addendum)
Subjective:    Patient ID: Timothy Proctor, male    DOB: 1996/08/01, 20 y.o.   MRN: [id number]   Timothy Proctor is a 20 y.o. male who presents today for an acute visit.    HPI Comments: Woke up today with right eye pain with blinking and redness[place] Doesn't wear contact lenses. No foreign body sensation or URI symptoms. No injury or changes in vision.    Conjunctivitis  The current episode started today. The problem has been unchanged. Associated symptoms include eye discharge (clear, runny) and eye pain. Pertinent negatives include no fever, no double vision, no eye itching, no photophobia, no nausea, no vomiting, no congestion, no ear pain, no headaches, no rhinorrhea, no sore throat, no cough and no eye redness. The eye pain is associated with movement.   Past Medical History  Diagnosis Date  . Frequent headaches   . Elevated blood pressure (not hypertension)   . Migraine   . Hypertension    Allergies: Review of patient's allergies indicates no known allergies. Current Outpatient Prescriptions on File Prior to Visit  Medication Sig Dispense Refill  . albuterol (PROVENTIL HFA;VENTOLIN HFA) 108 (90 Base) MCG/ACT inhaler Inhale 2 puffs into the lungs every 6 (six) hours as needed for wheezing or shortness of breath. 1 Inhaler 2  . amitriptyline (ELAVIL) 25 MG tablet Take 1 tablet (25 mg total) by mouth at bedtime. 30 tablet 1  . propranolol ER (INDERAL LA) 80 MG 24 hr capsule Take 1 capsule (80 mg total) by mouth daily. 30 capsule 1  . SUMAtriptan (IMITREX) 50 MG tablet Take 1 tablet by mouth at onset of migraine. May repeat in 2 hours if no improvement. Do not exceed 2 tablets in 24 hours. 10 tablet 0   No current facility-administered medications on file prior to visit.    Social History  Substance Use Topics  . Smoking status: Former Games developer  . Smokeless tobacco: Former Neurosurgeon    Quit date: 03/29/2015  . Alcohol Use: No    Review of Systems    Constitutional: Negative for fever and chills.  HENT: Negative for congestion, ear pain, rhinorrhea, sinus pressure and sore throat.   Eyes: Positive for pain and discharge (clear, runny). Negative for double vision, photophobia, redness and itching.  Respiratory: Negative for cough.   Cardiovascular: Negative for chest pain and palpitations.  Gastrointestinal: Negative for nausea and vomiting.  Neurological: Negative for headaches.      Objective:    BP 122/88 mmHg  Pulse 89  Temp(Src) 98.6 F (37 C)  Ht 6' (1.829 m)  Wt 229 lb 6.4 oz (104.055 kg)  BMI 31.11 kg/m2  SpO2 98%   Physical Exam  Constitutional: He appears well-developed and well-nourished.  HENT:  Head: Normocephalic and atraumatic.  Right Ear: Hearing, tympanic membrane, external ear and ear canal normal. No drainage, swelling or tenderness. Tympanic membrane is not injected, not erythematous and not bulging. No middle ear effusion. No decreased hearing is noted.  Left Ear: Hearing, tympanic membrane, external ear and ear canal normal. No drainage, swelling or tenderness. Tympanic membrane is not injected, not erythematous and not bulging.  No middle ear effusion. No decreased hearing is noted.  Nose: Nose normal. Right sinus exhibits no maxillary sinus tenderness and no frontal sinus tenderness. Left sinus exhibits no maxillary sinus tenderness and no frontal sinus tenderness.  Mouth/Throat: Uvula is midline, oropharynx is clear and moist and mucous membranes are normal. No oropharyngeal  exudate, posterior oropharyngeal edema, posterior oropharyngeal erythema or tonsillar abscesses.  No scalp tenderness  Eyes: EOM and lids are normal. Pupils are equal, round, and reactive to light. Lids are everted and swept, no foreign bodies found. Right eye exhibits no discharge. No foreign body present in the right eye. Left eye exhibits no discharge. No foreign body present in the left eye. Right conjunctiva has a hemorrhage.  No  external eye lesions. Surrounding skin intact.   Right eye:   Diffuse injection of the conjunctiva. No white spots, opacity, or foreign body appreciated. No collection of blood or pus in the anterior chamber. No ciliary flush surrounding iris.  Eye pain appreciated during exam.  No photophobia.     Cardiovascular: Regular rhythm and normal heart sounds.   Pulmonary/Chest: Effort normal and breath sounds normal. No respiratory distress. He has no wheezes. He has no rhonchi. He has no rales.  Lymphadenopathy:       Head (right side): No submental, no submandibular, no tonsillar, no preauricular, no posterior auricular and no occipital adenopathy present.       Head (left side): No submental, no submandibular, no tonsillar, no preauricular, no posterior auricular and no occipital adenopathy present.    He has no cervical adenopathy.       Right cervical: No superficial cervical, no deep cervical and no posterior cervical adenopathy present.      Left cervical: No superficial cervical, no deep cervical and no posterior cervical adenopathy present.  Neurological: He is alert.  Skin: Skin is warm and dry.  Psychiatric: He has a normal mood and affect. His speech is normal and behavior is normal.  Vitals reviewed.      Assessment & Plan:  1. Acute right eye pain  Working diagnosis of viral conjunctivitis supported by clear, runny discharge, injected conjunctiva. However I'm concerned with eye pain which patient describes as moderate.  In particular with extraocular movements, patient noted pain. Concern for alternate diagnoses including iritis or keratitis.   -Urgent referral to ophthalmology- appointment scheduled for tomorrow.    I am having [name] maintain his propranolol ER, albuterol, amitriptyline, and SUMAtriptan.   No orders of the defined types were placed in this encounter.     Start medications as prescribed and explained to patient on After Visit Summary ( AVS). Risks,  benefits, and alternatives of the medications and treatment plan prescribed today were discussed, and patient expressed understanding.   Education regarding symptom management and diagnosis given to patient.   Follow-up:Plan follow-up and return precautions given if any worsening symptoms or change in condition.   Continue to follow with Morrie Sheldonlark,Katherine Kendal, NP for routine health maintenance.   Paul HalfNicholas T Vasco and I agreed with plan.   Rennie PlowmanMargaret Iracema Lanagan, FNP   Note, per scheduler, patient was offered a morning slot for eye appointment however he politely declined and preferred an afternoon appointment.

## 2016-07-10 ENCOUNTER — Ambulatory Visit: Payer: Self-pay | Admitting: Primary Care

## 2016-07-12 ENCOUNTER — Ambulatory Visit (INDEPENDENT_AMBULATORY_CARE_PROVIDER_SITE_OTHER): Payer: 59 | Admitting: Primary Care

## 2016-07-12 ENCOUNTER — Encounter: Payer: Self-pay | Admitting: Primary Care

## 2016-07-12 VITALS — BP 146/86 | HR 85 | Temp 98.0°F | Ht 72.0 in | Wt 225.1 lb

## 2016-07-12 DIAGNOSIS — R519 Headache, unspecified: Secondary | ICD-10-CM

## 2016-07-12 DIAGNOSIS — G43001 Migraine without aura, not intractable, with status migrainosus: Secondary | ICD-10-CM | POA: Diagnosis not present

## 2016-07-12 DIAGNOSIS — R51 Headache: Secondary | ICD-10-CM | POA: Diagnosis not present

## 2016-07-12 DIAGNOSIS — G4700 Insomnia, unspecified: Secondary | ICD-10-CM | POA: Diagnosis not present

## 2016-07-12 MED ORDER — AMITRIPTYLINE HCL 50 MG PO TABS: 50.0000 mg | ORAL_TABLET | Freq: Every day | ORAL | 2 refills | Status: DC

## 2016-07-12 NOTE — Progress Notes (Signed)
Subjective:    Patient ID: DELANEY PARTSCH, male    DOB: 06-Feb-1996, 20 y.o.   MRN: [id number]  HPI  Mr. Buecker is a 20 year old male who presents today for medication refill. He has a history of frequent headaches with migraines. He was once managed on Propranolol ER 80 mg with temporary improvement. He was then switched to Amitriptyline 25 mg in April 2017 as the propanolol as ineffective. He was instructed to follow up 6 weeks later.   Since his last visit in April 2017 he's noticed significant improvement in headaches. He's been taking 2 tablets of the Amitriptyline for the past several months as 1 tablet alone was insufficient. He's noticed much improvement in sleep. He's experiencing headaches 1-2 times monthly on average and his migraines are few and far between. Denies visual changes, SI/HI.   2) Facial Acne: Long history of facial acne that is aggravated by frequent shaving to his beard line. His occupation is requesting that he clean shavings which will cause increased facial acne and discomfort. He is requesting a note today explaining why he cannot shave frequently so he can maintain a minimal beard.   Review of Systems  Eyes: Negative for photophobia and visual disturbance.  Skin:       Long history of facial acne, improved without frequent shaving.  Neurological: Negative for numbness and headaches.       Past Medical History:  Diagnosis Date  . Elevated blood pressure (not hypertension)   . Frequent headaches   . Hypertension   . Migraine      Social History   Social History  . Marital status: Single    Spouse name: N/A  . Number of children: N/A  . Years of education: N/A   Occupational History  . Not on file.   Social History Main Topics  . Smoking status: Former Games developer  . Smokeless tobacco: Former Neurosurgeon    Quit date: 03/29/2015  . Alcohol use No  . Drug use: Unknown  . Sexual activity: Not on file   Other Topics Concern  . Not on file   Social  History Narrative   Single.   Works at [employer].   Highest level of education completed was 10th grade.   Enjoys spending time with his girlfriend, playing basketball, exercising.    No past surgical history on file.  Family History  Problem Relation Age of Onset  . Hypertension Father   . Cancer Maternal Grandmother     lung  . Hypertension Mother     No Known Allergies  No current outpatient prescriptions on file prior to visit.   No current facility-administered medications on file prior to visit.     BP (!) 146/86   Pulse 85   Temp 98 F (36.7 C) (Oral)   Ht 6' (1.829 m)   Wt 225 lb 1.9 oz (102.1 kg)   SpO2 98%   BMI 30.53 kg/m    Objective:   Physical Exam  Constitutional: He appears well-nourished.  Eyes: EOM are normal. Pupils are equal, round, and reactive to light.  Cardiovascular: Normal rate and regular rhythm.   Pulmonary/Chest: Effort normal and breath sounds normal.  Neurological: No cranial nerve deficit.  Skin: Skin is warm and dry.  Short beard present today, no signs of skin irritation.          Assessment & Plan:  Facial acne:  Moderate to severe aggravation of facial acne with frequent shaving. No problem if  he maintains a short beard. Work note provided today questioning that he keep a short beard in order to prevent aggravation of acne.  Morrie Sheldon, NP

## 2016-07-12 NOTE — Progress Notes (Signed)
Pre visit review using our clinic review tool, if applicable. No additional management support is needed unless otherwise documented below in the visit note. 

## 2016-07-12 NOTE — Assessment & Plan Note (Signed)
Infrequent since initiation of amitriptyline. Will increase dose to 50 mg as he is been 2 tablets of 25 mg.

## 2016-07-12 NOTE — Assessment & Plan Note (Signed)
Much improved since headaches controlled.

## 2016-07-12 NOTE — Patient Instructions (Signed)
We have increased the dose of your Amitriptyline from 25 mg to 50 mg. Take 1 tablet by mouth at bedtime.  Please notify me if the medication isn't effective enough over time.  It was a pleasure to see you today!

## 2016-07-12 NOTE — Assessment & Plan Note (Signed)
Significantly improved since initiation of amitriptyline. Increase dose to 50 mg as he was taking 2 of the 25 mg tablets at once. Neuro exam unremarkable.

## 2016-07-19 ENCOUNTER — Ambulatory Visit: Payer: Self-pay | Admitting: Primary Care

## 2016-07-19 ENCOUNTER — Encounter: Payer: Self-pay | Admitting: Family Medicine

## 2016-07-19 ENCOUNTER — Ambulatory Visit (INDEPENDENT_AMBULATORY_CARE_PROVIDER_SITE_OTHER): Payer: 59 | Admitting: Family Medicine

## 2016-07-19 VITALS — BP 136/74 | HR 73 | Temp 98.4°F | Ht 72.0 in | Wt 226.4 lb

## 2016-07-19 DIAGNOSIS — L559 Sunburn, unspecified: Secondary | ICD-10-CM | POA: Diagnosis not present

## 2016-07-19 DIAGNOSIS — L03114 Cellulitis of left upper limb: Secondary | ICD-10-CM

## 2016-07-19 MED ORDER — CEPHALEXIN 500 MG PO CAPS: 500.0000 mg | ORAL_CAPSULE | Freq: Four times a day (QID) | ORAL | 0 refills | Status: DC

## 2016-07-19 NOTE — Progress Notes (Addendum)
   Subjective:    Patient ID: Timothy Proctor, male    DOB: 02/15/1996, 20 y.o.   MRN: [id number]  HPI This is a 20 yo male, accompanied by his girlfriend, who presents today with 5 days of pruritic rash on his arms and neck. It is worse on his left, distal forearm. This area is painful, more red and has been "weeping."  He was at the beach last week and got sunburned. Has been applying OTC hydorcortisone with minimal relief and taking occasional ibuprofen/tylenol. Skin not typically sensitive, although he did have contact dermatitis related to a fiberglass roof installation earlier this year.  No fever, no chills. Feels fatigued. [place] [employer]   Past Medical History:  Diagnosis Date  . Elevated blood pressure (not hypertension)   . Frequent headaches   . Hypertension   . Migraine    No past surgical history on file. Family History  Problem Relation Age of Onset  . Hypertension Mother   . Hypertension Father   . Cancer Maternal Grandmother     lung   Social History  Substance Use Topics  . Smoking status: Former Games developermoker  . Smokeless tobacco: Former NeurosurgeonUser    Quit date: 03/29/2015  . Alcohol use No      Review of Systems Per HPI    Objective:   Physical Exam  Constitutional: He is oriented to person, place, and time. He appears well-developed and well-nourished. No distress.  HENT:  Head: Normocephalic and atraumatic.  Eyes: Conjunctivae are normal.  Cardiovascular: Normal rate.   Pulmonary/Chest: Effort normal.  Musculoskeletal: Normal range of motion.  Neurological: He is alert and oriented to person, place, and time.  Skin: Skin is warm and dry. He is not diaphoretic.  Bilateral arms and upper chest, anterior neck with generalized erythema and skin peeling. Left distal, lateral forearm with approximately 6 cm area of increased erythema and mild edema, skin slightly warm. Wrist, hand and fingers with normal ROM. Brisk cap refill.   Psychiatric: He has a  normal mood and affect. His behavior is normal. Judgment and thought content normal.  Vitals reviewed.     BP 136/74 (BP Location: Left Arm, Patient Position: Sitting, Cuff Size: Large)   Pulse 73   Temp 98.4 F (36.9 C) (Oral)   Ht 6' (1.829 m)   Wt 226 lb 6.4 oz (102.7 kg)   SpO2 97%   BMI 30.71 kg/m      Assessment & Plan:  Discussed with Dr. Para Marchuncan who also examined patient 1. Cellulitis of left upper extremity - suspect secondary cellulitis following sunburn - Provided written and verbal information regarding diagnosis and treatment. - Elevate, cool compresses, ibuprofen - RTC precautions reviewed- fever/chills, increased redness, swelling or purulent discharge, or if not significantly improved in 48 hours - cephALEXin (KEFLEX) 500 MG capsule; Take 1 capsule (500 mg total) by mouth 4 (four) times daily.  Dispense: 28 capsule; Refill: 0 - out of work tomorrow; note provided  2. Burn from the sun - ibuprofen, cool showers, moisturizer - avoid sun exposure until healed - encouraged regular sun screen use     Timothy Reeeborah Rosalena Mccorry, FNP-BC  [facility], [facility]   07/19/2016 9:49 PM

## 2016-07-19 NOTE — Patient Instructions (Signed)
Please start your antibiotic tonight and take 4 times a day until all gone Please come back to clinic if you develop a fever or if the area is getting larger or more painful after 2 days of antibiotics or if streaks appear.   Cellulitis Cellulitis is an infection of the skin and the tissue beneath it. The infected area is usually red and tender. Cellulitis occurs most often in the arms and lower legs.  CAUSES  Cellulitis is caused by bacteria that enter the skin through cracks or cuts in the skin. The most common types of bacteria that cause cellulitis are staphylococci and streptococci. SIGNS AND SYMPTOMS   Redness and warmth.  Swelling.  Tenderness or pain.  Fever. DIAGNOSIS  Your health care provider can usually determine what is wrong based on a physical exam. Blood tests may also be done. TREATMENT  Treatment usually involves taking an antibiotic medicine. HOME CARE INSTRUCTIONS   Take your antibiotic medicine as directed by your health care provider. Finish the antibiotic even if you start to feel better.  Keep the infected arm or leg elevated to reduce swelling.  Apply a warm cloth to the affected area up to 4 times per day to relieve pain.  Take medicines only as directed by your health care provider.  Keep all follow-up visits as directed by your health care provider. SEEK MEDICAL CARE IF:   You notice red streaks coming from the infected area.  Your red area gets larger or turns dark in color.  Your bone or joint underneath the infected area becomes painful after the skin has healed.  Your infection returns in the same area or another area.  You notice a swollen bump in the infected area.  You develop new symptoms.  You have a fever. SEEK IMMEDIATE MEDICAL CARE IF:   You feel very sleepy.  You develop vomiting or diarrhea.  You have a general ill feeling (malaise) with muscle aches and pains.   This information is not intended to replace advice given to  you by your health care provider. Make sure you discuss any questions you have with your health care provider.   Document Released: 09/06/2005 Document Revised: 08/18/2015 Document Reviewed: 02/12/2012 Elsevier Interactive Patient Education [employer]2016 Elsevier Inc.

## 2016-09-11 ENCOUNTER — Encounter: Payer: Self-pay | Admitting: Primary Care

## 2016-09-11 ENCOUNTER — Ambulatory Visit (INDEPENDENT_AMBULATORY_CARE_PROVIDER_SITE_OTHER): Payer: 59 | Admitting: Primary Care

## 2016-09-11 VITALS — BP 152/98 | HR 129 | Temp 98.1°F | Ht 72.0 in | Wt 227.8 lb

## 2016-09-11 DIAGNOSIS — R51 Headache: Secondary | ICD-10-CM | POA: Diagnosis not present

## 2016-09-11 DIAGNOSIS — R519 Headache, unspecified: Secondary | ICD-10-CM

## 2016-09-11 MED ORDER — METHYLPREDNISOLONE ACETATE 80 MG/ML IJ SUSP
80.0000 mg | Freq: Once | INTRAMUSCULAR | Status: AC
  Administered 2016-09-11: 80 mg via INTRAMUSCULAR

## 2016-09-11 NOTE — Progress Notes (Signed)
Pre visit review using our clinic review tool, if applicable. No additional management support is needed unless otherwise documented below in the visit note. 

## 2016-09-11 NOTE — Assessment & Plan Note (Addendum)
Overall improvement with amitriptyline 50 mg, but cannot take everyday as he will experience drowsiness the following day. Does still experience headaches with medications. Failed numerous treatment options. Never completed CT head as instructed.  Re-ordered CT head. Referral placed to headache specialist for further evaluation. Neuro exam unremarkable. IM Depo Medrol provided today to abort headache.

## 2016-09-11 NOTE — Progress Notes (Signed)
Subjective:    Patient ID: Timothy Proctor, male    DOB: 05/19/1996, 20 y.o.   MRN: [id number]  HPI  [name] is a 20 year old male who presents today with a chief complaint of headache. His headache is located to the right frontal lobe but will switch to the left side at times. His headache has been present for the past 5 days. He descries his headache as throbbing.  He's been taking Dayquil and Ibuprofen with some improvement. He has mild nausea. He denies photophobia and phonophobia, fevers, cough, chills.   He has a history of migraines and frequent headaches and is managed on Amitriptyline 50 mg. He cannot always take his amitriptyline as he will experience drowsiness the following day. It was recommended he obtain a CT of his head to rule out any cause, but he did not make this appointment.  He has failed treatment on Excedrin Migraine, Topamax, Propranolol ER, and Imitrex. His headaches are overall improved on amitriptyline but will still experience moderate headaches once monthly.    Review of Systems  Constitutional: Negative for chills and fever.  HENT: Positive for congestion. Negative for sinus pressure.   Eyes: Negative for photophobia and visual disturbance.  Respiratory: Negative for cough and shortness of breath.   Gastrointestinal: Negative for nausea.  Neurological: Positive for headaches. Negative for dizziness and numbness.       Past Medical History:  Diagnosis Date  . Elevated blood pressure (not hypertension)   . Frequent headaches   . Hypertension   . Migraine      Social History   Social History  . Marital status: Single    Spouse name: N/A  . Number of children: N/A  . Years of education: N/A   Occupational History  . Not on file.   Social History Main Topics  . Smoking status: Former Games developermoker  . Smokeless tobacco: Former NeurosurgeonUser    Quit date: 03/29/2015  . Alcohol use No  . Drug use: Unknown  . Sexual activity: Not on file   Other Topics  Concern  . Not on file   Social History Narrative   Single.   [place] [employer]   Highest level of education completed was 10th grade.   Enjoys spending time with his girlfriend, playing basketball, exercising.    No past surgical history on file.  Family History  Problem Relation Age of Onset  . Hypertension Mother   . Hypertension Father   . Cancer Maternal Grandmother     lung    No Known Allergies  Current Outpatient Prescriptions on File Prior to Visit  Medication Sig Dispense Refill  . amitriptyline (ELAVIL) 50 MG tablet Take 1 tablet (50 mg total) by mouth at bedtime. 90 tablet 2   No current facility-administered medications on file prior to visit.     BP (!) 152/98   Pulse (!) 129   Temp 98.1 F (36.7 C) (Oral)   Ht 6' (1.829 m)   Wt 227 lb 12.8 oz (103.3 kg)   SpO2 98%   BMI 30.90 kg/m    Objective:   Physical Exam  Constitutional: He is oriented to person, place, and time. He appears well-nourished.  Eyes: EOM are normal. Pupils are equal, round, and reactive to light.  Neck: Neck supple.  Cardiovascular: Normal rate and regular rhythm.   Pulmonary/Chest: Effort normal and breath sounds normal.  Neurological: He is alert and oriented to person, place, and time. No cranial nerve deficit.  Skin: Skin is warm and dry.          Assessment & Plan:

## 2016-09-11 NOTE — Patient Instructions (Signed)
Stop by the front desk and speak with either Shirlee LimerickMarion or Revonda StandardAllison regarding your referral to the [facility] and also for your CT scan.  Continue amitriptyline as prescribed.  It was a pleasure to see you today!

## 2016-09-12 ENCOUNTER — Ambulatory Visit: Payer: Self-pay | Admitting: Primary Care

## 2016-09-13 ENCOUNTER — Ambulatory Visit
Admission: RE | Admit: 2016-09-13 | Discharge: 2016-09-13 | Disposition: A | Discharge status: Home / Self Care | Payer: 59 | Source: Ambulatory Visit | Attending: Primary Care | Admitting: Primary Care

## 2016-09-13 DIAGNOSIS — G43909 Migraine, unspecified, not intractable, without status migrainosus: Secondary | ICD-10-CM | POA: Diagnosis not present

## 2016-09-13 DIAGNOSIS — R519 Headache, unspecified: Secondary | ICD-10-CM

## 2016-09-13 DIAGNOSIS — R51 Headache: Secondary | ICD-10-CM | POA: Insufficient documentation

## 2016-09-13 MED ORDER — IOPAMIDOL (ISOVUE-370) INJECTION 76%
75.0000 mL | Freq: Once | INTRAVENOUS | Status: AC | PRN
  Administered 2016-09-13: 75 mL via INTRAVENOUS

## 2016-10-05 ENCOUNTER — Encounter: Payer: Self-pay | Admitting: Family Medicine

## 2016-10-05 ENCOUNTER — Ambulatory Visit (INDEPENDENT_AMBULATORY_CARE_PROVIDER_SITE_OTHER): Payer: 59 | Admitting: Family Medicine

## 2016-10-05 ENCOUNTER — Encounter: Payer: Self-pay | Admitting: *Deleted

## 2016-10-05 VITALS — BP 130/74 | HR 93 | Temp 97.5°F | Wt 231.5 lb

## 2016-10-05 DIAGNOSIS — K0889 Other specified disorders of teeth and supporting structures: Secondary | ICD-10-CM | POA: Diagnosis not present

## 2016-10-05 DIAGNOSIS — J069 Acute upper respiratory infection, unspecified: Secondary | ICD-10-CM

## 2016-10-05 DIAGNOSIS — B9789 Other viral agents as the cause of diseases classified elsewhere: Secondary | ICD-10-CM

## 2016-10-05 MED ORDER — TRAMADOL HCL 50 MG PO TABS: 50.0000 mg | ORAL_TABLET | Freq: Two times a day (BID) | ORAL | 0 refills | Status: DC | PRN

## 2016-10-05 NOTE — Progress Notes (Signed)
Subjective:    Patient ID: Timothy Proctor, male    DOB: 03/21/1996, 20 y.o.   MRN: [id number]  HPI This is a 20 yo male, accompanied by his girl friend, who presents today with one month of tooth pain. Has been to the dentist and needs the tooth pulled. Is saving his money to have pulled. Bad tooth is on left side on bottom. Has had a course of antibiotic (unknown name) that he completed. No fever. No drainage from tooth/gum, no bad taste in his mouth.  Also has had cold symptoms x 3 days, runny nose, left ear pain, sore throat, yellow- green mucus, no wheezing. No SOB. No myalgias. Has taken some Dayquil without relief. Has taken ibuprofen 600 mg without relief. Girlfriend with recent similar illness. No asthma, non smoker.    Past Medical History:  Diagnosis Date  . Elevated blood pressure (not hypertension)   . Frequent headaches   . Hypertension   . Migraine    No past surgical history on file. Family History  Problem Relation Age of Onset  . Hypertension Mother   . Hypertension Father   . Cancer Maternal Grandmother     lung   Social History  Substance Use Topics  . Smoking status: Former Games developermoker  . Smokeless tobacco: Former NeurosurgeonUser    Quit date: 03/29/2015  . Alcohol use No      Review of Systems Per HPI    Objective:   Physical Exam  Constitutional: He is oriented to person, place, and time. He appears well-developed and well-nourished. No distress.  HENT:  Head: Normocephalic and atraumatic.  Right Ear: External ear and ear canal normal.  Left Ear: External ear and ear canal normal.  Nose: Mucosal edema and rhinorrhea present.  Mouth/Throat: Uvula is midline. Posterior oropharyngeal erythema present. No oropharyngeal exudate or posterior oropharyngeal edema.    Bilateral TMs dull.   Eyes: Conjunctivae are normal.  Cardiovascular: Normal rate.   Pulmonary/Chest: Effort normal.  Neurological: He is alert and oriented to person, place, and time.  Skin: Skin  is warm and dry. He is not diaphoretic.  Psychiatric: He has a normal mood and affect. His behavior is normal. Judgment and thought content normal.  Vitals reviewed.     BP 130/74 (BP Location: Left Arm, Patient Position: Sitting, Cuff Size: Large)   Pulse 93   Temp 97.5 F (36.4 C) (Oral)   Wt 231 lb 8 oz (105 kg)   SpO2 98%   BMI 31.40 kg/m  Wt Readings from Last 3 Encounters:  10/05/16 231 lb 8 oz (105 kg)  09/11/16 227 lb 12.8 oz (103.3 kg) (98 %, Z= 1.99)*  07/19/16 226 lb 6.4 oz (102.7 kg) (98 %, Z= 1.98)*   * Growth percentiles are based on CDC 2-20 Years data.       Assessment & Plan:  1. Pain in tooth - does not appear infected at this time, can continue OTC analgesics and add, sparingly, tramadol - tooth needs to be pulled and he has been in touch with his dentist, provided him some additional dental resources to see if he can get it done sooner - RTC precautions reviewed - traMADol (ULTRAM) 50 MG tablet; Take 1 tablet (50 mg total) by mouth every 12 (twelve) hours as needed.  Dispense: 14 tablet; Refill: 0  2. Viral URI - discussed symptomatic treatment   Olean Reeeborah Gessner, FNP-BC  [facility], [facility]  10/06/2016 1:17 PM

## 2016-10-05 NOTE — Progress Notes (Signed)
Pre visit review using our clinic review tool, if applicable. No additional management support is needed unless otherwise documented below in the visit note. 

## 2016-10-05 NOTE — Patient Instructions (Signed)
For nasal congestion you can use Afrin nasal spray for 3 days max, Sudafed, saline nasal spray (generic is fine for all). For cough you can try Delsym. Drink enough fluids to make your urine light yellow. For fever/chill/muscle aches you can take over the counter acetaminophen or ibuprofen.  Please come back in if you are not better in 5-7 days or if you develop wheezing, shortness of breath or persistent vomiting.    Dental Pain Dental pain may be caused by many things, including:  Tooth decay (cavities or caries). Cavities expose the nerve of your tooth to air and hot or cold temperatures. This can cause pain or discomfort.  Abscess or infection. A dental abscess is a collection of infected pus from a bacterial infection in the inner part of the tooth (pulp). It usually occurs at the end of the tooth's root.  Injury.  An unknown reason (idiopathic). Your pain may be mild or severe. It may only occur when:  You are chewing.  You are exposed to hot or cold temperature.  You are eating or drinking sugary foods or beverages, such as soda or candy. Your pain may also be constant. HOME CARE INSTRUCTIONS Watch your dental pain for any changes. The following actions may help to lessen any discomfort that you are feeling:  Take medicines only as directed by your dentist.  If you were prescribed an antibiotic medicine, finish all of it even if you start to feel better.  Keep all follow-up visits as directed by your dentist. This is important.  Do not apply heat to the outside of your face.  Rinse your mouth or gargle with salt water if directed by your dentist. This helps with pain and swelling.  You can make salt water by adding  tsp of salt to 1 cup of warm water.  Apply ice to the painful area of your face:  Put ice in a plastic bag.  Place a towel between your skin and the bag.  Leave the ice on for 20 minutes, 2-3 times per day.  Avoid foods or drinks that cause you  pain, such as:  Very hot or very cold foods or drinks.  Sweet or sugary foods or drinks. SEEK MEDICAL CARE IF:  Your pain is not controlled with medicines.  Your symptoms are worse.  You have new symptoms. SEEK IMMEDIATE MEDICAL CARE IF:  You are unable to open your mouth.  You are having trouble breathing or swallowing.  You have a fever.  Your face, neck, or jaw is swollen.   This information is not intended to replace advice given to you by your health care provider. Make sure you discuss any questions you have with your health care provider.   Document Released: 11/27/2005 Document Revised: 04/13/2015 Document Reviewed: 11/23/2014 Elsevier Interactive Patient Education [employer]2016 Elsevier Inc.

## 2016-10-23 ENCOUNTER — Ambulatory Visit (INDEPENDENT_AMBULATORY_CARE_PROVIDER_SITE_OTHER): Payer: 59 | Admitting: Primary Care

## 2016-10-23 ENCOUNTER — Encounter: Payer: Self-pay | Admitting: Primary Care

## 2016-10-23 VITALS — BP 152/112 | HR 82 | Temp 98.7°F | Ht 72.0 in | Wt 229.4 lb

## 2016-10-23 DIAGNOSIS — G43001 Migraine without aura, not intractable, with status migrainosus: Secondary | ICD-10-CM | POA: Diagnosis not present

## 2016-10-23 DIAGNOSIS — R030 Elevated blood-pressure reading, without diagnosis of hypertension: Secondary | ICD-10-CM | POA: Diagnosis not present

## 2016-10-23 DIAGNOSIS — R519 Headache, unspecified: Secondary | ICD-10-CM

## 2016-10-23 DIAGNOSIS — R51 Headache: Secondary | ICD-10-CM | POA: Diagnosis not present

## 2016-10-23 MED ORDER — ONDANSETRON 4 MG PO TBDP
4.0000 mg | ORAL_TABLET | Freq: Once | ORAL | Status: AC
  Administered 2016-10-23: 4 mg via ORAL

## 2016-10-23 MED ORDER — KETOROLAC TROMETHAMINE 60 MG/2ML IM SOLN
60.0000 mg | Freq: Once | INTRAMUSCULAR | Status: AC
  Administered 2016-10-23: 60 mg via INTRAMUSCULAR

## 2016-10-23 NOTE — Progress Notes (Signed)
   Subjective:    Patient ID: Timothy Proctor, male    DOB: 08/02/1996, 20 y.o.   MRN: [id number]  HPI  [name] is a 20 year old male with a history of migraines who presents today with a chief complaint of migraine. His migraine is located to the left temporal region that has been present for the past 3 days. He's experienced photophobia and nausea. He denies vomiting. He's taken ibuprofen (last dose at 2am today) without much improvement. Over the weekend he noticed improvement until this morning. He missed work Friday last week and today due to his migraine.   He is currently managed on Amitriptyline 50 mg HS for chronic headaches and frequent migraines. During his last visit he noticed much improvement in migraines since initiation of Amitriptlyine. He underwent CTA in October which was unremarkable. A referral was placed to neurology for further evaluation of migraines. He's not yet seen neurology due to work conflicts and plans on rescheduling. He's had 3 major migraines since his last visit 6 weeks ago.      Review of Systems  Eyes: Positive for photophobia.  Gastrointestinal: Positive for nausea. Negative for vomiting.  Neurological: Positive for headaches.       Past Medical History:  Diagnosis Date  . Elevated blood pressure (not hypertension)   . Frequent headaches   . Hypertension   . Migraine      Social History   Social History  . Marital status: Single    Spouse name: N/A  . Number of children: N/A  . Years of education: N/A   Occupational History  . Not on file.   Social History Main Topics  . Smoking status: Former Games developermoker  . Smokeless tobacco: Former NeurosurgeonUser    Quit date: 03/29/2015  . Alcohol use No  . Drug use: Unknown  . Sexual activity: Not on file   Other Topics Concern  . Not on file   Social History Narrative   Single.   [place] [employer].   Highest level of education completed was 10th grade.   Enjoys spending time with his  girlfriend, playing basketball, exercising.    No past surgical history on file.  Family History  Problem Relation Age of Onset  . Hypertension Mother   . Hypertension Father   . Cancer Maternal Grandmother     lung    No Known Allergies  Current Outpatient Prescriptions on File Prior to Visit  Medication Sig Dispense Refill  . amitriptyline (ELAVIL) 50 MG tablet Take 1 tablet (50 mg total) by mouth at bedtime. 90 tablet 2   No current facility-administered medications on file prior to visit.     BP (!) 152/112   Pulse 82   Temp 98.7 F (37.1 C) (Oral)   Ht 6' (1.829 m)   Wt 229 lb 6.4 oz (104.1 kg)   SpO2 98%   BMI 31.11 kg/m    Objective:   Physical Exam  Constitutional: He is oriented to person, place, and time. He appears well-nourished.  Eyes: EOM are normal. Pupils are equal, round, and reactive to light.  Cardiovascular: Normal rate and regular rhythm.   Pulmonary/Chest: Effort normal and breath sounds normal.  Neurological: He is alert and oriented to person, place, and time. No cranial nerve deficit.          Assessment & Plan:

## 2016-10-23 NOTE — Assessment & Plan Note (Addendum)
Migraine since Friday last week. + photophobia and nausea. Neuro exam unremarkable. IM Toradol and Zofran provided today. No improvement with Triptans in the past. Check BMP today as there's no recent renal function in chart.

## 2016-10-23 NOTE — Assessment & Plan Note (Signed)
Overall improved on Amitriptyline.  Plans on rescheduling with Neuro, strongly encouraged he do so. CTA unremarkable.

## 2016-10-23 NOTE — Progress Notes (Signed)
Pre visit review using our clinic review tool, if applicable. No additional management support is needed unless otherwise documented below in the visit note. 

## 2016-10-23 NOTE — Patient Instructions (Addendum)
You were provided with injections of Toradol and Zofran for migraine today.  Continue amitriptyline as prescribed. Please reschedule your appointment with the neurologist.   Complete lab work prior to leaving today. I will notify you of your results once received.   It was a pleasure to see you today!

## 2016-10-24 ENCOUNTER — Telehealth: Payer: Self-pay | Admitting: Radiology

## 2016-10-24 NOTE — Addendum Note (Signed)
Addended by: Alvina ChouWALSH, Dariana Garbett J on: 10/24/2016 10:26 AM   Modules accepted: Orders

## 2016-10-24 NOTE — Telephone Encounter (Signed)
LMOM for pt to come in for lab tests, he left before getting blood work done

## 2016-11-01 ENCOUNTER — Other Ambulatory Visit (INDEPENDENT_AMBULATORY_CARE_PROVIDER_SITE_OTHER): Payer: 59

## 2016-11-01 ENCOUNTER — Encounter: Payer: Self-pay | Admitting: *Deleted

## 2016-11-01 DIAGNOSIS — R030 Elevated blood-pressure reading, without diagnosis of hypertension: Secondary | ICD-10-CM

## 2016-11-01 DIAGNOSIS — G43001 Migraine without aura, not intractable, with status migrainosus: Secondary | ICD-10-CM | POA: Diagnosis not present

## 2016-11-01 DIAGNOSIS — R519 Headache, unspecified: Secondary | ICD-10-CM

## 2016-11-01 DIAGNOSIS — R51 Headache: Secondary | ICD-10-CM | POA: Diagnosis not present

## 2016-11-01 LAB — BASIC METABOLIC PANEL
BUN: 12 mg/dL (ref 6–23)
CHLORIDE: 103 meq/L (ref 96–112)
CO2: 30 meq/L (ref 19–32)
Calcium: 9.7 mg/dL (ref 8.4–10.5)
Creatinine, Ser: 0.96 mg/dL (ref 0.40–1.50)
GFR: 105.99 mL/min (ref >60.00)
GLUCOSE: 95 mg/dL (ref 70–99)
POTASSIUM: 3.9 meq/L (ref 3.5–5.1)
SODIUM: 141 meq/L (ref 135–145)

## 2016-11-30 ENCOUNTER — Encounter: Payer: Self-pay | Admitting: Primary Care

## 2016-11-30 ENCOUNTER — Ambulatory Visit (INDEPENDENT_AMBULATORY_CARE_PROVIDER_SITE_OTHER): Payer: 59 | Admitting: Primary Care

## 2016-11-30 VITALS — BP 148/100 | HR 69 | Temp 97.8°F | Ht 72.0 in | Wt 235.4 lb

## 2016-11-30 DIAGNOSIS — M25561 Pain in right knee: Secondary | ICD-10-CM | POA: Diagnosis not present

## 2016-11-30 DIAGNOSIS — I10 Essential (primary) hypertension: Secondary | ICD-10-CM | POA: Insufficient documentation

## 2016-11-30 MED ORDER — AMLODIPINE BESYLATE 5 MG PO TABS: 5.0000 mg | ORAL_TABLET | Freq: Every day | ORAL | 0 refills | Status: DC

## 2016-11-30 MED ORDER — NAPROXEN 500 MG PO TABS: 500.0000 mg | ORAL_TABLET | Freq: Two times a day (BID) | ORAL | 0 refills | Status: DC

## 2016-11-30 NOTE — Progress Notes (Signed)
Pre visit review using our clinic review tool, if applicable. No additional management support is needed unless otherwise documented below in the visit note. 
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
## 2016-11-30 NOTE — Patient Instructions (Signed)
Blood Pressure:  Start Amlodipine 10 mg tablets for blood pressure. Take 1 tablet by mouth every morning. Take this everyday.  Knee Pain:  You may take Naproxen 500 mg tablets for inflammation and pain to your knee. Take 1 tablet by mouth twice daily with food.  Elevate, ice, rest your knee when at home.  Purchase a knee brace as discussed to support your knee during the day.  Please schedule a follow up visit in 3 weeks to recheck your blood pressure.  It was a pleasure to see you today!  DASH Eating Plan DASH stands for "Dietary Approaches to Stop Hypertension." The DASH eating plan is a healthy eating plan that has been shown to reduce high blood pressure (hypertension). Additional health benefits may include reducing the risk of type 2 diabetes mellitus, heart disease, and stroke. The DASH eating plan may also help with weight loss. What do I need to know about the DASH eating plan? For the DASH eating plan, you will follow these general guidelines:  Choose foods with less than 150 milligrams of sodium per serving (as listed on the food label).  Use salt-free seasonings or herbs instead of table salt or sea salt.  Check with your health care provider or pharmacist before using salt substitutes.  Eat lower-sodium products. These are often labeled as "low-sodium" or "no salt added."  Eat fresh foods. Avoid eating a lot of canned foods.  Eat more vegetables, fruits, and low-fat dairy products.  Choose whole grains. Look for the word "whole" as the first word in the ingredient list.  Choose fish and skinless chicken or Malaw[place]urkey more often than red meat. Limit fish, poultry, and meat to 6 oz (170 g) each day.  Limit sweets, desserts, sugars, and sugary drinks.  Choose heart-healthy fats.  Eat more home-cooked food and less restaurant, buffet, and fast food.  Limit fried foods.  Do not fry foods. Cook foods using methods such as baking, boiling, grilling, and broiling  instead.  When eating at a restaurant, ask that your food be prepared with less salt, or no salt if possible. What foods can I eat? Seek help from a dietitian for individual calorie needs. Grains  Whole grain or whole wheat bread. Brown rice. Whole grain or whole wheat pasta. Quinoa, bulgur, and whole grain cereals. Low-sodium cereals. Corn or whole wheat flour tortillas. Whole grain cornbread. Whole grain crackers. Low-sodium crackers. Vegetables  Fresh or frozen vegetables (raw, steamed, roasted, or grilled). Low-sodium or reduced-sodium tomato and vegetable juices. Low-sodium or reduced-sodium tomato sauce and paste. Low-sodium or reduced-sodium canned vegetables. Fruits  All fresh, canned (in natural juice), or frozen fruits. Meat and Other Protein Products  Ground beef (85% or leaner), grass-fed beef, or beef trimmed of fat. Skinless chicken or Mal[place]urkey. Ground chicken or [place]urkey. Pork trimmed of fat. All fish and seafood. Eggs. Dried beans, peas, or lentils. Unsalted nuts and seeds. Unsalted canned beans. Dairy  Low-fat dairy products, such as skim or 1% milk, 2% or reduced-fat cheeses, low-fat ricotta or cottage cheese, or plain low-fat yogurt. Low-sodium or reduced-sodium cheeses. Fats and Oils  Tub margarines without trans fats. Light or reduced-fat mayonnaise and salad dressings (reduced sodium). Avocado. Safflower, olive, or canola oils. Natural peanut or almond butter. Other  Unsalted popcorn and pretzels. The items listed above may not be a complete list of recommended foods or beverages. Contact your dietitian for more options.  What foods are not recommended? Grains  White bread. White pasta. White rice. Refined  cornbread. Bagels and croissants. Crackers that contain trans fat. Vegetables  Creamed or fried vegetables. Vegetables in a cheese sauce. Regular canned vegetables. Regular canned tomato sauce and paste. Regular tomato and vegetable juices. Fruits  Canned fruit in light  or heavy syrup. Fruit juice. Meat and Other Protein Products  Fatty cuts of meat. Ribs, chicken wings, bacon, sausage, bologna, salami, chitterlings, fatback, hot dogs, bratwurst, and packaged luncheon meats. Salted nuts and seeds. Canned beans with salt. Dairy  Whole or 2% milk, cream, half-and-half, and cream cheese. Whole-fat or sweetened yogurt. Full-fat cheeses or blue cheese. Nondairy creamers and whipped toppings. Processed cheese, cheese spreads, or cheese curds. Condiments  Onion and garlic salt, seasoned salt, table salt, and sea salt. Canned and packaged gravies. Worcestershire sauce. Tartar sauce. Barbecue sauce. Teriyaki sauce. Soy sauce, including reduced sodium. Steak sauce. Fish sauce. Oyster sauce. Cocktail sauce. Horseradish. Ketchup and mustard. Meat flavorings and tenderizers. Bouillon cubes. Hot sauce. Tabasco sauce. Marinades. Taco seasonings. Relishes. Fats and Oils  Butter, stick margarine, lard, shortening, ghee, and bacon fat. Coconut, palm kernel, or palm oils. Regular salad dressings. Other  Pickles and olives. Salted popcorn and pretzels. The items listed above may not be a complete list of foods and beverages to avoid. Contact your dietitian for more information.  Where can I find more information? National Heart, Lung, and Blood Institute: Cable[employer].itwww.nhlbi.nih.gov/health/health-topics/topics/dash/ This information is not intended to replace advice given to you by your health care provider. Make sure you discuss any questions you have with your health care provider. Document Released: 11/16/2011 Document Revised: 05/04/2016 Document Reviewed: 10/01/2013 Elsevier Interactive Patient Education  2017 [employer]

## 2016-11-30 NOTE — Assessment & Plan Note (Signed)
Elevated on multiple visits. Suspect triggering headaches as he has one now. Rx for Amlodipine 5 mg sent to pharmacy. Discussed DASH diet. Follow up in 2-3 weeks for BP recheck.

## 2016-12-14 ENCOUNTER — Encounter: Payer: Self-pay | Admitting: *Deleted

## 2016-12-14 ENCOUNTER — Ambulatory Visit (INDEPENDENT_AMBULATORY_CARE_PROVIDER_SITE_OTHER): Payer: 59 | Admitting: Family Medicine

## 2016-12-14 ENCOUNTER — Telehealth: Payer: Self-pay | Admitting: *Deleted

## 2016-12-14 ENCOUNTER — Encounter: Payer: Self-pay | Admitting: Family Medicine

## 2016-12-14 VITALS — BP 128/90 | HR 86 | Temp 98.2°F | Wt 235.0 lb

## 2016-12-14 DIAGNOSIS — J069 Acute upper respiratory infection, unspecified: Secondary | ICD-10-CM

## 2016-12-14 DIAGNOSIS — J029 Acute pharyngitis, unspecified: Secondary | ICD-10-CM | POA: Diagnosis not present

## 2016-12-14 DIAGNOSIS — B9789 Other viral agents as the cause of diseases classified elsewhere: Secondary | ICD-10-CM | POA: Diagnosis not present

## 2016-12-14 LAB — POCT RAPID STREP A (OFFICE): RAPID STREP A SCREEN: NEGATIVE

## 2016-12-14 NOTE — Telephone Encounter (Signed)
It may cause increased sensitivity to the sun therefore causing a reaction to his skin. I recommend he continue the Amlodipine until our follow up visit on the 15th.

## 2016-12-14 NOTE — Telephone Encounter (Signed)
Spoken and notified patient of Kate's comments. Patient verbalized understanding. 

## 2016-12-14 NOTE — Progress Notes (Signed)
Pre visit review using our clinic review tool, if applicable. No additional management support is needed unless otherwise documented below in the visit note. 

## 2016-12-14 NOTE — Telephone Encounter (Signed)
Pt was seen today by Eunice Blaseebbie for an acute illness but he wanted me to let you know that since starting on the amlodipine he has developed dry skin mainly on his forehead and he also has more acne on his forehead too. Pt ? If this is a side eff from the amlodipine, pt is still taking med

## 2016-12-14 NOTE — Progress Notes (Signed)
   Subjective:    Patient ID: Timothy Proctor, male    DOB: 06/20/1996, 21 y.o.   MRN: [id number]  HPI This is a 21 yo male who presents today with nasal congestion, headache, sore throat x 2 days. Feels a little better today. Didn't take anything. No fever. Had cough yesterday, none today. Hard to swallow. Left side of face warm and slightly swollen. Sudden onset yesterday.  No known sick contacts. No nausea, no SOB. Able to swallow liquids without difficulty. Has not eaten anything today.   Past Medical History:  Diagnosis Date  . Elevated blood pressure (not hypertension)   . Frequent headaches   . Hypertension   . Migraine    No past surgical history on file. Family History  Problem Relation Age of Onset  . Hypertension Mother   . Hypertension Father   . Cancer Maternal Grandmother     lung   Social History  Substance Use Topics  . Smoking status: Former Games developermoker  . Smokeless tobacco: Former NeurosurgeonUser    Quit date: 03/29/2015  . Alcohol use No      Review of Systems     Objective:   Physical Exam  Constitutional: He appears well-developed and well-nourished. He appears ill. No distress.  HENT:  Head: Normocephalic and atraumatic.  Right Ear: Tympanic membrane and external ear normal.  Left Ear: Tympanic membrane and external ear normal.  Nose: Nose normal.  Mouth/Throat: Uvula is midline. Posterior oropharyngeal erythema present. No tonsillar abscesses.  Cheeks flushed. Left side of face appears slightly puffy, no induration or focal swelling.  Uvula mildly bulbous on distal end. No obstruction.   Cardiovascular: Normal rate, regular rhythm and normal heart sounds.   Pulmonary/Chest: Effort normal and breath sounds normal.  Skin: He is not diaphoretic.  Vitals reviewed.  BP 128/90 (BP Location: Right Arm, Patient Position: Sitting, Cuff Size: Large)   Pulse 86   Temp 98.2 F (36.8 C) (Oral)   Wt 235 lb (106.6 kg)   SpO2 97%   BMI 31.87 kg/m    Rapid strep-  negative Assessment & Plan:  1. Pharyngitis, unspecified etiology - Culture, Group A Strep - POCT rapid strep A - Provided written and verbal information regarding diagnosis and treatment.  2. Viral URI -  Patient instructions: For nasal congestion you can use Afrin nasal spray for 3 days max, Sudafed, saline nasal spray (generic is fine for all). For cough you can try Delsym. Drink enough fluids to make your urine light yellow. For fever/chill/muscle aches you can take over the counter acetaminophen or ibuprofen.  Please come back in if you are not better in 5-7 days or if you develop wheezing, shortness of breath or persistent vomiting.  Olean Reeeborah Gessner, FNP-BC  [facility], [facility]  12/14/2016 3:08 PM

## 2016-12-14 NOTE — Patient Instructions (Signed)
For nasal congestion you can use Afrin nasal spray for 3 days max, Sudafed, saline nasal spray (generic is fine for all). For cough you can try Delsym. Drink enough fluids to make your urine light yellow. For fever/chill/muscle aches you can take over the counter acetaminophen or ibuprofen.  Please come back in if you are not better in 5-7 days or if you develop wheezing, shortness of breath or persistent vomiting.   Sore Throat A sore throat is pain, burning, irritation, or scratchiness in the throat. When you have a sore throat, you may feel pain or tenderness in your throat when you swallow or talk. Many things can cause a sore throat, including:  An infection.  Seasonal allergies.  Dryness in the air.  Irritants, such as smoke or pollution.  Gastroesophageal reflux disease (GERD).  A tumor. A sore throat is often the first sign of another sickness. It may happen with other symptoms, such as coughing, sneezing, fever, and swollen neck glands. Most sore throats go away without medical treatment. Follow these instructions at home:  Take over-the-counter medicines only as told by your health care provider.  Drink enough fluids to keep your urine clear or pale yellow.  Rest as needed.  To help with pain, try:  Sipping warm liquids, such as broth, herbal tea, or warm water.  Eating or drinking cold or frozen liquids, such as frozen ice pops.  Gargling with a salt-water mixture 3-4 times a day or as needed. To make a salt-water mixture, completely dissolve -1 tsp of salt in 1 cup of warm water.  Sucking on hard candy or throat lozenges.  Putting a cool-mist humidifier in your bedroom at night to moisten the air.  Sitting in the bathroom with the door closed for 5-10 minutes while you run hot water in the shower.  Do not use any tobacco products, such as cigarettes, chewing tobacco, and e-cigarettes. If you need help quitting, ask your health care provider. Contact a health  care provider if:  You have a fever for more than 2-3 days.  You have symptoms that last (are persistent) for more than 2-3 days.  Your throat does not get better within 7 days.  You have a fever and your symptoms suddenly get worse. Get help right away if:  You have difficulty breathing.  You cannot swallow fluids, soft foods, or your saliva.  You have increased swelling in your throat or neck.  You have persistent nausea and vomiting. This information is not intended to replace advice given to you by your health care provider. Make sure you discuss any questions you have with your health care provider. Document Released: 01/04/2005 Document Revised: 07/23/2016 Document Reviewed: 09/17/2015 Elsevier Interactive Patient Education  2017 [employer]

## 2016-12-16 LAB — CULTURE, GROUP A STREP: ORGANISM ID, BACTERIA: NORMAL

## 2016-12-21 ENCOUNTER — Encounter: Payer: Self-pay | Admitting: Primary Care

## 2016-12-21 ENCOUNTER — Ambulatory Visit (INDEPENDENT_AMBULATORY_CARE_PROVIDER_SITE_OTHER): Payer: 59 | Admitting: Primary Care

## 2016-12-21 VITALS — BP 146/94 | HR 68 | Temp 98.0°F | Ht 72.0 in | Wt 239.1 lb

## 2016-12-21 DIAGNOSIS — B001 Herpesviral vesicular dermatitis: Secondary | ICD-10-CM

## 2016-12-21 MED ORDER — VALACYCLOVIR HCL 1 G PO TABS: 1000.0000 mg | ORAL_TABLET | Freq: Two times a day (BID) | ORAL | 0 refills | Status: DC

## 2016-12-21 NOTE — Progress Notes (Signed)
Pre visit review using our clinic review tool, if applicable. No additional management support is needed unless otherwise documented below in the visit note. 

## 2016-12-21 NOTE — Progress Notes (Signed)
   Subjective:    Patient ID: Timothy Proctor, male    DOB: 08/09/1996, 21 y.o.   MRN: [id number]  HPI  Timothy Proctor is a 21 year old male who presents today with a chief complaint of cold sores. His cold sore is located to the right upper lip which began 4 days ago. He has a history of these sores since his childhood. He experiences breakouts every several years as his last breakout was years ago. He's tried OTC cold sore treatment including oragel cold sore and Abreva with some improvement, but without resolve. He denies fevers, genital sores, bleeding, numbness.  Review of Systems  Constitutional: Negative for fever.  HENT:       Herpes labialis   Genitourinary: Negative for genital sores.  Neurological: Negative for numbness.       Past Medical History:  Diagnosis Date  . Elevated blood pressure (not hypertension)   . Frequent headaches   . Hypertension   . Migraine      Social History   Social History  . Marital status: Single    Spouse name: N/A  . Number of children: N/A  . Years of education: N/A   Occupational History  . Not on file.   Social History Main Topics  . Smoking status: Former Games developermoker  . Smokeless tobacco: Former NeurosurgeonUser    Quit date: 03/29/2015  . Alcohol use No  . Drug use: No  . Sexual activity: Not on file   Other Topics Concern  . Not on file   Social History Narrative   Single.   [place] [employer]   Highest level of education completed was 10th grade.   Enjoys spending time with his girlfriend, playing basketball, exercising.    No past surgical history on file.  Family History  Problem Relation Age of Onset  . Hypertension Mother   . Hypertension Father   . Cancer Maternal Grandmother     lung    No Known Allergies  Current Outpatient Prescriptions on File Prior to Visit  Medication Sig Dispense Refill  . amitriptyline (ELAVIL) 50 MG tablet Take 1 tablet (50 mg total) by mouth at bedtime. 90 tablet 2  . amLODipine  (NORVASC) 5 MG tablet Take 1 tablet (5 mg total) by mouth daily. 30 tablet 0  . naproxen (NAPROSYN) 500 MG tablet Take 1 tablet (500 mg total) by mouth 2 (two) times daily with a meal. (Patient not taking: Reported on 12/21/2016) 30 tablet 0   No current facility-administered medications on file prior to visit.     BP (!) 146/94   Pulse 68   Temp 98 F (36.7 C) (Oral)   Ht 6' (1.829 m)   Wt 239 lb 1.9 oz (108.5 kg)   SpO2 99%   BMI 32.43 kg/m    Objective:   Physical Exam  Constitutional: He appears well-nourished.  HENT:  Small lesion to right upper lip  Neck: Neck supple.  Cardiovascular: Normal rate and regular rhythm.   Pulmonary/Chest: Effort normal and breath sounds normal.          Assessment & Plan:  Herpes Labialis:  History of since childhood. Sore today with some improvement with OTC treatment. Rx for Valtrex sent to pharmacy. Discussed to notify if cold sores recur and are more frequent.  Morrie Sheldonlark,Kamare Caspers Kendal, NP

## 2016-12-21 NOTE — Patient Instructions (Signed)
Start valacyclovir tablets for cold sore. Take 2 tablets by mouth twice daily for one day.  Purchase a blood pressure cuff. Check your blood pressure daily, around the same time of day, for the next 1-2 weeks.  Ensure that you have rested for 30 minutes prior to checking your blood pressure. Record your readings and I'll call you for your readings in 1 week.  It was a pleasure to see you today!

## 2016-12-25 ENCOUNTER — Ambulatory Visit: Payer: 59 | Admitting: Primary Care

## 2016-12-29 ENCOUNTER — Telehealth: Payer: Self-pay | Admitting: Primary Care

## 2016-12-29 NOTE — Telephone Encounter (Signed)
Message left for patient to return my call.  

## 2016-12-29 NOTE — Telephone Encounter (Signed)
-----   Message from Doreene NestKatherine K Loyce Klasen, NP sent at 12/21/2016  9:01 AM EST ----- Regarding: BP Please check on patient's blood pressure readings.

## 2017-01-02 ENCOUNTER — Encounter: Payer: Self-pay | Admitting: *Deleted

## 2017-01-03 NOTE — Telephone Encounter (Signed)
Left message to return call 

## 2017-01-04 ENCOUNTER — Telehealth: Payer: Self-pay | Admitting: Primary Care

## 2017-01-04 ENCOUNTER — Ambulatory Visit (INDEPENDENT_AMBULATORY_CARE_PROVIDER_SITE_OTHER): Payer: 59 | Admitting: Family Medicine

## 2017-01-04 ENCOUNTER — Encounter: Payer: Self-pay | Admitting: Family Medicine

## 2017-01-04 VITALS — BP 140/90 | HR 92 | Temp 98.1°F | Ht 72.0 in | Wt 241.0 lb

## 2017-01-04 DIAGNOSIS — R1013 Epigastric pain: Secondary | ICD-10-CM

## 2017-01-04 DIAGNOSIS — R768 Other specified abnormal immunological findings in serum: Secondary | ICD-10-CM | POA: Diagnosis not present

## 2017-01-04 DIAGNOSIS — R0789 Other chest pain: Secondary | ICD-10-CM | POA: Diagnosis not present

## 2017-01-04 DIAGNOSIS — R760 Raised antibody titer: Secondary | ICD-10-CM

## 2017-01-04 LAB — LIPASE: Lipase: 9 U/L — ABNORMAL LOW (ref 11.0–59.0)

## 2017-01-04 LAB — CBC WITH DIFFERENTIAL/PLATELET
Basophils Absolute: 0 10*3/uL (ref 0.0–0.1)
Basophils Relative: 0.4 % (ref 0.0–3.0)
Eosinophils Absolute: 0.3 10*3/uL (ref 0.0–0.7)
Eosinophils Relative: 3.4 % (ref 0.0–5.0)
HCT: 46.5 % (ref 39.0–52.0)
Hemoglobin: 16 g/dL (ref 13.0–17.0)
Lymphocytes Relative: 28 % (ref 12.0–46.0)
Lymphs Abs: 2.4 10*3/uL (ref 0.7–4.0)
MCHC: 34.5 g/dL (ref 30.0–36.0)
MCV: 90.6 fl (ref 78.0–100.0)
Monocytes Absolute: 0.8 10*3/uL (ref 0.1–1.0)
Monocytes Relative: 9.1 % (ref 3.0–12.0)
Neutro Abs: 5.1 10*3/uL (ref 1.4–7.7)
Neutrophils Relative %: 59.1 % (ref 43.0–77.0)
Platelets: 254 10*3/uL (ref 150.0–400.0)
RBC: 5.13 Mil/uL (ref 4.22–5.81)
RDW: 12.5 % (ref 11.5–14.6)
WBC: 8.6 10*3/uL (ref 4.5–10.5)

## 2017-01-04 LAB — COMPREHENSIVE METABOLIC PANEL
ALT: 17 U/L (ref 0–53)
AST: 11 U/L (ref 0–37)
Albumin: 4.5 g/dL (ref 3.5–5.2)
Alkaline Phosphatase: 85 U/L (ref 39–117)
BUN: 11 mg/dL (ref 6–23)
CO2: 30 mEq/L (ref 19–32)
Calcium: 9.7 mg/dL (ref 8.4–10.5)
Chloride: 104 mEq/L (ref 96–112)
Creatinine, Ser: 0.9 mg/dL (ref 0.40–1.50)
GFR: 113.98 mL/min (ref >60.00)
Glucose, Bld: 76 mg/dL (ref 70–99)
Potassium: 5 mEq/L (ref 3.5–5.1)
Sodium: 141 mEq/L (ref 135–145)
Total Bilirubin: 1 mg/dL (ref 0.2–1.2)
Total Protein: 7.2 g/dL (ref 6.0–8.3)

## 2017-01-04 LAB — H. PYLORI ANTIBODY, IGG: H Pylori IgG: POSITIVE — AB

## 2017-01-04 MED ORDER — SUCRALFATE 1 G PO TABS: 1.0000 g | ORAL_TABLET | Freq: Three times a day (TID) | ORAL | 0 refills | Status: DC

## 2017-01-04 MED ORDER — ESOMEPRAZOLE MAGNESIUM 20 MG PO PACK: 20.0000 mg | PACK | Freq: Every day | ORAL | 12 refills | Status: DC

## 2017-01-04 NOTE — Telephone Encounter (Signed)
Pt has appt with Dr Helane RimaErica Wallace 11/04/17 at 1:30.

## 2017-01-04 NOTE — Progress Notes (Signed)
Patient ID: Timothy Proctor, male  DOB: 03/25/1996  Age: 21 y.o. MRN: [id number]  Timothy Proctor is a 21 y.o. male here to discuss:   History of Present Illness:   Timothy Proctor complains of chest pain. Onset was several days ago. Symptoms have been unchanged since that time. The patient's pain is intermittent. The patient describes the pain as burning, dull and radiates to the epigastrium. Patient rates pain as a 3/10 in intensity. Associated symptoms are: dyspnea. Aggravating factors are: walking. Alleviating factors are: none. Patient's cardiac risk factors are: hypertension, male gender, obesity (BMI >= 30 kg/m2) and smoking/ tobacco exposure. Patient's risk factors for DVT/PE: none. Previous cardiac testing: none.   PMHx, SurgHx, SocialHx, Medications, and Allergies were reviewed in the Visit Navigator and updated as appropriate.  Past Medical History:  Diagnosis Date  . Elevated blood pressure (not hypertension)   . Frequent headaches   . Herpes labialis   . Hypertension   . Migraine       Family History  Problem Relation Age of Onset  . Hypertension Mother   . Hypertension Father   . Cancer Maternal Grandmother     lung   Social History  Substance Use Topics  . Smoking status: Former Games developermoker  . Smokeless tobacco: Former NeurosurgeonUser    Quit date: 03/29/2015  . Alcohol use No      No Known Allergies    Current Outpatient Prescriptions:  .  amitriptyline (ELAVIL) 50 MG tablet, Take 1 tablet (50 mg total) by mouth at bedtime., Disp: 90 tablet, Rfl: 2 .  amLODipine (NORVASC) 5 MG tablet, Take 1 tablet (5 mg total) by mouth daily., Disp: 30 tablet, Rfl: 0 .  valACYclovir (VALTREX) 1000 MG tablet, Take 1 tablet (1,000 mg total) by mouth 2 (two) times daily. (Patient not taking: Reported on 01/04/2017), Disp: 4 tablet, Rfl: 0   REVIEW OF SYSTEMS: Pertinent items noted in HPI and remainder of comprehensive ROS otherwise negative.   Physical Exam:   Vitals:   01/04/17  1332  BP: 140/90  Pulse: 92  Temp: 98.1 F (36.7 C)     Body mass index is 32.69 kg/m.  General: Alert, cooperative, appears stated age and no distress.  HEENT:  Normocephalic, without obvious abnormality, atraumatic. Conjunctivae/corneas clear. PERRL, EOM's intact. Normal TM's and external ear canals both ears. Nares normal. Septum midline. Mucosa normal. No drainage or sinus tenderness. Lips, mucosa, and tongue normal; teeth and gums normal.  Lungs: Clear to auscultation bilaterally.  Heart:: Regular rate and rhythm, S1, S2 normal, no murmur, click, rub or gallop.  Abdomen: Soft, mild ttp epigastrum without rebound or gaurding; bowel sounds normal; no masses,  no organomegaly.  Extremities: Extremities normal, atraumatic, no cyanosis or edema.  Pulses: 2+ and symmetric.  Skin: Skin color, texture, turgor normal. No rashes or lesions.  Neurologic: Alert and oriented X 3, normal strength and tone. Normal symmetric. reflexes. Normal coordination and gait.  Psych: Alert,oriented, in NAD with a full range of affect, normal behavior and no psychotic features   Sinus rhythm, rate 83, no ST or T-wave changes concerning for ischemia, no pathological Q-waves.   Assessment and Plan:   Timothy Proctor was seen today for abdominal pain.  Diagnoses and all orders for this visit:  Atypical chest pain Comments: EKG reassuring. Suspect gastritis. Will start treatment as below. Red flags reviewed. Orders: -     EKG 12-Lead -     CBC with Differential/Platelet -  Comprehensive metabolic panel -     Lipase  Dyspepsia -     sucralfate (CARAFATE) 1 g tablet; Take 1 tablet (1 g total) by mouth 4 (four) times daily -  with meals and at bedtime. -     esomeprazole (NEXIUM) 20 MG packet; Take 20 mg by mouth daily before breakfast. -     H. pylori antibody, IgG    Medications prescribed today Requested Prescriptions   Signed Prescriptions Disp Refills  . sucralfate (CARAFATE) 1 g tablet 120  tablet 0    Sig: Take 1 tablet (1 g total) by mouth 4 (four) times daily -  with meals and at bedtime.  Marland Kitchen esomeprazole (NEXIUM) 20 MG packet 30 each 12    Sig: Take 20 mg by mouth daily before breakfast.   Orders Orders Placed This Encounter  Procedures  . H. pylori antibody, IgG  . CBC with Differential/Platelet  . Comprehensive metabolic panel    Order Specific Question:   Has the patient fasted?    Answer:   No  . Lipase  . EKG 12-Lead    Helane Rima, D.O. Family Medicine  [facility]

## 2017-01-04 NOTE — Progress Notes (Signed)
Pre visit review using our clinic review tool, if applicable. No additional management support is needed unless otherwise documented below in the visit note. 

## 2017-01-04 NOTE — Telephone Encounter (Signed)
 [facility] - Client TELEPHONE ADVICE RECORD [facility] Patient Name: Timothy Proctor [place] DOB: 05/06/1996 Initial Comment Caller says, has bad abd pains, it has been going on for a few weeks. Appt line sent him to triage . Nurse Assessment Nurse: Lane HackerHarley, RN, Elvin SoWindy Date/Time (Eastern Time): 01/04/2017 10:13:00 AM Confirm and document reason for call. If symptomatic, describe symptoms. ---Caller states that he is having severe upper abdominal pain for few wks now. If he is active, then c/o shortness of breath. Denies CP or fever. Does the patient have any new or worsening symptoms? ---Yes Will a triage be completed? ---Yes Related visit to physician within the last 2 weeks? ---No Does the PT have any chronic conditions? (i.e. diabetes, asthma, etc.) ---Yes List chronic conditions. ---HTN Is this a behavioral health or substance abuse call? ---No Guidelines Guideline Title Affirmed Question Affirmed Notes Abdominal Pain - Upper [1] MILD-MODERATE pain AND [2] constant AND [3] present > 2 hours Final Disposition User See Physician within 4 Hours (or PCP triage) Lane HackerHarley, RN, Windy Referrals GO TO FACILITY OTHER - SPECIFY Disagree/Comply: Comply Unable to find appts at [facility] or [facility] offices.

## 2017-01-05 MED ORDER — BIS SUBCIT-METRONID-TETRACYC 140-125-125 MG PO CAPS: 3.0000 | ORAL_CAPSULE | Freq: Three times a day (TID) | ORAL | 0 refills | Status: DC

## 2017-01-05 NOTE — Addendum Note (Signed)
Addended by: Helane RimaWALLACE, Fred Hammes R on: 01/05/2017 10:42 AM   Modules accepted: Orders

## 2017-01-19 ENCOUNTER — Ambulatory Visit (INDEPENDENT_AMBULATORY_CARE_PROVIDER_SITE_OTHER): Payer: 59 | Admitting: Primary Care

## 2017-01-19 ENCOUNTER — Encounter: Payer: Self-pay | Admitting: Primary Care

## 2017-01-19 VITALS — BP 146/98 | HR 72 | Temp 98.2°F | Ht 72.0 in | Wt 246.4 lb

## 2017-01-19 DIAGNOSIS — G43001 Migraine without aura, not intractable, with status migrainosus: Secondary | ICD-10-CM | POA: Diagnosis not present

## 2017-01-19 DIAGNOSIS — I10 Essential (primary) hypertension: Secondary | ICD-10-CM | POA: Diagnosis not present

## 2017-01-19 DIAGNOSIS — R51 Headache: Secondary | ICD-10-CM

## 2017-01-19 DIAGNOSIS — R519 Headache, unspecified: Secondary | ICD-10-CM

## 2017-01-19 MED ORDER — METHYLPREDNISOLONE ACETATE 80 MG/ML IJ SUSP
80.0000 mg | Freq: Once | INTRAMUSCULAR | Status: AC
  Administered 2017-01-19: 80 mg via INTRAMUSCULAR

## 2017-01-19 MED ORDER — AMITRIPTYLINE HCL 50 MG PO TABS: 25.0000 mg | ORAL_TABLET | Freq: Every day | ORAL | 2 refills | Status: DC

## 2017-01-19 MED ORDER — AMLODIPINE BESYLATE 10 MG PO TABS: 10.0000 mg | ORAL_TABLET | Freq: Every day | ORAL | 0 refills | Status: DC

## 2017-01-19 NOTE — Patient Instructions (Signed)
We've increased the dose of your Amlodipine from 5 mg to 10 mg. You may take two of the 5 mg tablets until your bottle is empty. Take one of the 10 mg tablets when you pick those up from the pharmacy.   You must take your blood pressure medication everyday!  Continue amitriptyline 25 mg for headache prevention.  You were provided with an injection for Depo Medrol to abort your migraine.   Follow up in 1 month for blood pressure check.  It was a pleasure to see you today!

## 2017-01-19 NOTE — Assessment & Plan Note (Signed)
No recent migraine.  Treat today with Depo medrol 80 mg. Also increased BP meds to prevent headaches migraines. Continue amitriptyline.

## 2017-01-19 NOTE — Addendum Note (Signed)
Addended by: Tawnya CrookSAMBATH, Jaymi Tinner on: 01/19/2017 12:07 PM   Modules accepted: Orders

## 2017-01-19 NOTE — Assessment & Plan Note (Signed)
Not taking meds daily, stressed compliance as high BP likely contributing to headaches/migraines. Increased Amlodipine to 10 mg. Follow up in 1 month for BP check, call for readings in 2 weeks.

## 2017-01-19 NOTE — Progress Notes (Signed)
Pre visit review using our clinic review tool, if applicable. No additional management support is needed unless otherwise documented below in the visit note. 

## 2017-01-19 NOTE — Assessment & Plan Note (Signed)
Continue amitriptyline 25 mg HS. Will increase BP meds as hopefully this will help.

## 2017-01-19 NOTE — Progress Notes (Signed)
Subjective:    Patient ID: Timothy Proctor, male    DOB: 12/06/1996, 21 y.o.   MRN: [id number]  HPI  Timothy Proctor is a 21 year old male with a history of hypertension and migraines who presents today with a chief complaint of migraine. He is currently managed on Amlodipine 5 mg for high blood pressure and amitriptyline 50 mg (1/2 tablet) for frequent headaches/migraines.   His recent migraine began 3 days ago and is located to his entire head, mostly to the right temporal side. He's experienced photophobia. He denies nausea. He's taken ibuprofen without improvement. His last dose of ibuprofen was this morning.   He's checked his BP for several days at home and reports they were "high". He does not take his BP medication daily. He has noticed improvement in his headaches when he does take his BP medication. His BP in the office today is 146/98. His BP while taking his medication was 140/90 last visit.  Review of Systems  Eyes: Positive for photophobia.  Respiratory: Negative for shortness of breath.   Cardiovascular: Negative for chest pain.  Gastrointestinal: Negative for nausea.  Neurological: Positive for headaches.       Past Medical History:  Diagnosis Date  . Elevated blood pressure (not hypertension)   . Frequent headaches   . Herpes labialis   . Hypertension   . Migraine      Social History   Social History  . Marital status: Single    Spouse name: N/A  . Number of children: N/A  . Years of education: N/A   Occupational History  . Not on file.   Social History Main Topics  . Smoking status: Former Games developermoker  . Smokeless tobacco: Former NeurosurgeonUser    Quit date: 03/29/2015  . Alcohol use No  . Drug use: No  . Sexual activity: Not on file   Other Topics Concern  . Not on file   Social History Narrative   Single.   [place] [employer]   Highest level of education completed was 10th grade.   Enjoys spending time with his girlfriend, playing basketball, exercising.     No past surgical history on file.  Family History  Problem Relation Age of Onset  . Hypertension Mother   . Hypertension Father   . Cancer Maternal Grandmother     lung    No Known Allergies  Current Outpatient Prescriptions on File Prior to Visit  Medication Sig Dispense Refill  . bismuth-metronidazole-tetracycline (PYLERA) 140-125-125 MG capsule Take 3 capsules by mouth 4 (four) times daily -  before meals and at bedtime. (Patient not taking: Reported on 01/19/2017) 168 capsule 0  . esomeprazole (NEXIUM) 20 MG packet Take 20 mg by mouth daily before breakfast. (Patient not taking: Reported on 01/19/2017) 30 each 12  . sucralfate (CARAFATE) 1 g tablet Take 1 tablet (1 g total) by mouth 4 (four) times daily -  with meals and at bedtime. (Patient not taking: Reported on 01/19/2017) 120 tablet 0  . valACYclovir (VALTREX) 1000 MG tablet Take 1 tablet (1,000 mg total) by mouth 2 (two) times daily. (Patient not taking: Reported on 01/04/2017) 4 tablet 0   No current facility-administered medications on file prior to visit.     BP (!) 146/98   Pulse 72   Temp 98.2 F (36.8 C) (Oral)   Ht 6' (1.829 m)   Wt 246 lb 6.4 oz (111.8 kg)   SpO2 99%   BMI 33.42 kg/m  Objective:   Physical Exam  Eyes: EOM are normal. Pupils are equal, round, and reactive to light.  Cardiovascular: Normal rate and regular rhythm.   Pulmonary/Chest: Effort normal and breath sounds normal.  Neurological: No cranial nerve deficit.  Skin: Skin is warm and dry.          Assessment & Plan:

## 2017-02-02 ENCOUNTER — Telehealth: Payer: Self-pay | Admitting: Primary Care

## 2017-02-02 NOTE — Telephone Encounter (Signed)
Message left for patient to return my call.  

## 2017-02-02 NOTE — Telephone Encounter (Signed)
-----   Message from Doreene NestKatherine K Starling Jessie, NP sent at 01/19/2017 11:20 AM EST ----- Regarding: BP Please check on patient's BP since we increased his Amlodipine to 10 mg.

## 2017-02-05 NOTE — Telephone Encounter (Signed)
Message left for patient to return my call.  

## 2017-02-06 NOTE — Telephone Encounter (Signed)
Message left for patient to return my call.  

## 2017-02-15 ENCOUNTER — Other Ambulatory Visit: Payer: Self-pay

## 2017-02-15 DIAGNOSIS — B001 Herpesviral vesicular dermatitis: Secondary | ICD-10-CM

## 2017-02-15 MED ORDER — VALACYCLOVIR HCL 1 G PO TABS: 1000.0000 mg | ORAL_TABLET | Freq: Two times a day (BID) | ORAL | 0 refills | Status: DC

## 2017-02-15 NOTE — Telephone Encounter (Signed)
Leah left v/m requesting [facility] for re-occuring fever blisters. Leah request pt called back. Last refilled # 4 tab on 12/21/16; pt last seen 02/09/8.

## 2017-02-16 NOTE — Telephone Encounter (Signed)
Reanna calling for status of work note; wants to pick up note today.Please advise.

## 2017-02-16 NOTE — Telephone Encounter (Signed)
Note was left at front desk requesting a note for work for pt to be excused from work 02/13/17 through 02/16/17 due to fever blister. Please advise.

## 2017-02-16 NOTE — Telephone Encounter (Addendum)
Noted, especially without an examination.

## 2017-02-16 NOTE — Telephone Encounter (Addendum)
I spoke with Mayra ReelKate Clark NP and she said she was sorry but in her medical opinion she cannot give a note to be out of work for 4 days due to 2 fever blisters. Reanna voiced understanding and hung up.FYI to North Star[place].

## 2017-03-27 ENCOUNTER — Encounter: Payer: Self-pay | Admitting: *Deleted

## 2017-03-27 ENCOUNTER — Encounter (INDEPENDENT_AMBULATORY_CARE_PROVIDER_SITE_OTHER): Payer: Self-pay

## 2017-03-27 ENCOUNTER — Encounter: Payer: Self-pay | Admitting: Internal Medicine

## 2017-03-27 ENCOUNTER — Ambulatory Visit (INDEPENDENT_AMBULATORY_CARE_PROVIDER_SITE_OTHER): Payer: 59 | Admitting: Internal Medicine

## 2017-03-27 VITALS — BP 128/92 | HR 72 | Temp 98.4°F | Wt 243.8 lb

## 2017-03-27 DIAGNOSIS — G43019 Migraine without aura, intractable, without status migrainosus: Secondary | ICD-10-CM | POA: Diagnosis not present

## 2017-03-27 MED ORDER — KETOROLAC TROMETHAMINE 30 MG/ML IJ SOLN
30.0000 mg | Freq: Once | INTRAMUSCULAR | Status: AC
  Administered 2017-03-27: 30 mg via INTRAMUSCULAR

## 2017-03-27 MED ORDER — NORTRIPTYLINE HCL 25 MG PO CAPS: 25.0000 mg | ORAL_CAPSULE | Freq: Every day | ORAL | 3 refills | Status: DC

## 2017-03-27 MED ORDER — METHYLPREDNISOLONE ACETATE 80 MG/ML IJ SUSP
80.0000 mg | Freq: Once | INTRAMUSCULAR | Status: AC
  Administered 2017-03-27: 80 mg via INTRAMUSCULAR

## 2017-03-27 NOTE — Progress Notes (Signed)
Pre visit review using our clinic review tool, if applicable. No additional management support is needed unless otherwise documented below in the visit note. 

## 2017-03-27 NOTE — Addendum Note (Signed)
Addended by: Desmond Dike on: 03/27/2017 12:18 PM   Modules accepted: Orders

## 2017-03-27 NOTE — Progress Notes (Signed)
   Subjective:    Patient ID: Timothy Proctor, male    DOB: [birth year], 21 y.o.   MRN: [id number]  HPI Here due to headache  This migraine is a bit different--- severity is worse Switching from right side to left now (temples) Pounding No nausea (he usually gets this) No neuro symptoms of weakness, aphasia, facial droop Bad photophobia this time--not typical for him No sig sonophobia  Had been on elavil--but made him too sleepy in the day Sumatriptan and propranolol didn't help Side effects with topiramate  Current Outpatient Prescriptions on File Prior to Visit  Medication Sig Dispense Refill  . amLODipine (NORVASC) 10 MG tablet Take 1 tablet (10 mg total) by mouth daily. 90 tablet 0   No current facility-administered medications on file prior to visit.     No Known Allergies  Past Medical History:  Diagnosis Date  . Elevated blood pressure (not hypertension)   . Frequent headaches   . Herpes labialis   . Hypertension   . Migraine     No past surgical history on file.  Family History  Problem Relation Age of Onset  . Hypertension Mother   . Hypertension Father   . Cancer Maternal Grandmother     lung    Social History   Social History  . Marital status: Single    Spouse name: N/A  . Number of children: N/A  . Years of education: N/A   Occupational History  . Not on file.   Social History Main Topics  . Smoking status: Former Games developer  . Smokeless tobacco: Former Neurosurgeon    Quit date: 03/29/2015  . Alcohol use No  . Drug use: No  . Sexual activity: Not on file   Other Topics Concern  . Not on file   Social History Narrative   Single.   Works at [employer].   Highest level of education completed was 10th grade.   Enjoys spending time with his girlfriend, playing basketball, exercising.   Review of Systems  No fever Hasn't been sick able to eat     Objective:   Physical Exam  Constitutional: He is oriented to person, place, and time. He  appears well-nourished. No distress.  Neurological: He is alert and oriented to person, place, and time. He has normal strength. He displays no tremor. No cranial nerve deficit. He exhibits normal muscle tone. Coordination and gait normal.          Assessment & Plan:

## 2017-03-27 NOTE — Assessment & Plan Note (Signed)
Will treat with steroids and toradol IM Trial of nortriptyline at bedtime to see if it works as preventative without the sedation Needs evaluation by neurology as well

## 2017-04-24 DIAGNOSIS — R51 Headache: Secondary | ICD-10-CM | POA: Diagnosis not present

## 2017-05-09 ENCOUNTER — Emergency Department
Admission: EM | Admit: 2017-05-09 | Discharge: 2017-05-09 | Disposition: A | Discharge status: Home / Self Care | Payer: 59 | Attending: Emergency Medicine | Admitting: Emergency Medicine

## 2017-05-09 ENCOUNTER — Emergency Department: Payer: 59

## 2017-05-09 DIAGNOSIS — I10 Essential (primary) hypertension: Secondary | ICD-10-CM | POA: Insufficient documentation

## 2017-05-09 DIAGNOSIS — S60511A Abrasion of right hand, initial encounter: Secondary | ICD-10-CM | POA: Insufficient documentation

## 2017-05-09 DIAGNOSIS — R55 Syncope and collapse: Secondary | ICD-10-CM | POA: Insufficient documentation

## 2017-05-09 DIAGNOSIS — Y999 Unspecified external cause status: Secondary | ICD-10-CM | POA: Diagnosis not present

## 2017-05-09 DIAGNOSIS — Z87891 Personal history of nicotine dependence: Secondary | ICD-10-CM | POA: Insufficient documentation

## 2017-05-09 DIAGNOSIS — S6991XA Unspecified injury of right wrist, hand and finger(s), initial encounter: Secondary | ICD-10-CM | POA: Diagnosis not present

## 2017-05-09 DIAGNOSIS — Y939 Activity, unspecified: Secondary | ICD-10-CM | POA: Insufficient documentation

## 2017-05-09 DIAGNOSIS — Z79899 Other long term (current) drug therapy: Secondary | ICD-10-CM | POA: Diagnosis not present

## 2017-05-09 DIAGNOSIS — M25531 Pain in right wrist: Secondary | ICD-10-CM

## 2017-05-09 DIAGNOSIS — W1839XA Other fall on same level, initial encounter: Secondary | ICD-10-CM | POA: Diagnosis not present

## 2017-05-09 DIAGNOSIS — Y92009 Unspecified place in unspecified non-institutional (private) residence as the place of occurrence of the external cause: Secondary | ICD-10-CM | POA: Diagnosis not present

## 2017-05-09 LAB — BASIC METABOLIC PANEL
ANION GAP: 8 (ref 5–15)
BUN: 12 mg/dL (ref 6–20)
CALCIUM: 9.5 mg/dL (ref 8.9–10.3)
CO2: 28 mmol/L (ref 22–32)
Chloride: 103 mmol/L (ref 101–111)
Creatinine, Ser: 1.03 mg/dL (ref 0.61–1.24)
GLUCOSE: 107 mg/dL — AB (ref 65–99)
POTASSIUM: 4.2 mmol/L (ref 3.5–5.1)
Sodium: 139 mmol/L (ref 135–145)

## 2017-05-09 LAB — CBC
HEMATOCRIT: 46.3 % (ref 40.0–52.0)
HEMOGLOBIN: 16.4 g/dL (ref 13.0–18.0)
MCH: 31.4 pg (ref 26.0–34.0)
MCHC: 35.3 g/dL (ref 32.0–36.0)
MCV: 88.8 fL (ref 80.0–100.0)
Platelets: 212 10*3/uL (ref 150–440)
RBC: 5.22 MIL/uL (ref 4.40–5.90)
RDW: 13.2 % (ref 11.5–14.5)
WBC: 5.5 10*3/uL (ref 3.8–10.6)

## 2017-05-09 LAB — GLUCOSE, CAPILLARY: Glucose-Capillary: 102 mg/dL — ABNORMAL HIGH (ref 65–99)

## 2017-05-09 NOTE — ED Triage Notes (Addendum)
Pt arrives to ER via POV after syncope at approx 1430 today. Pt had tooth pulled at 1330 today. Pt does not remember event. Pain to right hand sustained after fall. No hx of syncope. Pt alert and oriented X4, active, cooperative, pt in NAD. RR even and unlabored, color WNL.

## 2017-05-09 NOTE — ED Provider Notes (Signed)
[facility] Emergency Department Provider Note  ____________________________________________  Time seen: Approximately 3:32 PM  I have reviewed the triage vital signs and the nursing notes.   HISTORY  Chief Complaint Loss of Consciousness    HPI Timothy Proctor is a 21 y.o. male comes to the ED after syncope. He reports that at 1:30 today he had a left lower tooth extracted that his dentist office. Before then, he has not had anything to eat or drink today in preparation for the procedure. He is then riding home in the car feeling fine, and when he went to get out of the car and stood up at home, he suddenly passed out. He recovered within a minute, but when he woke back up he had some pain in the right wrist. Denies any preceding symptoms or any other sequelae of the fall. Currently asymptomatic other than some pain in the right wrist. Pain is constant, aching, nonradiating, worse with movement, no alleviating factors.    Past Medical History:  Diagnosis Date  . Elevated blood pressure (not hypertension)   . Frequent headaches   . Herpes labialis   . Hypertension   . Migraine      Patient Active Problem List   Diagnosis Date Noted  . Essential hypertension 11/30/2016  . Insomnia 03/20/2016  . Frequent headaches 09/13/2015  . Migraine without aura, intractable 05/19/2015     History reviewed. No pertinent surgical history.   Prior to Admission medications   Medication Sig Start Date End Date Taking? Authorizing Provider  amLODipine (NORVASC) 10 MG tablet Take 1 tablet (10 mg total) by mouth daily. 01/19/17   Doreene Nestlark, Katherine K, NP  nortriptyline (PAMELOR) 25 MG capsule Take 1-2 capsules (25-50 mg total) by mouth at bedtime. As migraine prevention. 03/27/17   Karie SchwalbeLetvak, Richard I, MD     Allergies Patient has no known allergies.   Family History  Problem Relation Age of Onset  . Hypertension Mother   . Hypertension Father   . Cancer  Maternal Grandmother        lung    Social History Social History  Substance Use Topics  . Smoking status: Former Games developermoker  . Smokeless tobacco: Former NeurosurgeonUser    Quit date: 03/29/2015  . Alcohol use No    Review of Systems  Constitutional:   No fever or chills.  ENT:   No sore throat. No rhinorrhea. Cardiovascular:   No chest pain or syncope. Respiratory:   No dyspnea or cough. Gastrointestinal:   Negative for abdominal pain, vomiting and diarrhea.  Musculoskeletal:   Right wrist pain as above All other systems reviewed and are negative except as documented above in ROS and HPI.  ____________________________________________   PHYSICAL EXAM:  VITAL SIGNS: ED Triage Vitals  Enc Vitals Group     BP 05/09/17 1507 (!) 137/94     Pulse Rate 05/09/17 1507 80     Resp 05/09/17 1507 16     Temp 05/09/17 1507 98.5 F (36.9 C)     Temp Source 05/09/17 1507 Oral     SpO2 05/09/17 1507 99 %     Weight 05/09/17 1508 235 lb (106.6 kg)     Height 05/09/17 1508 6' (1.829 m)     Head Circumference --      Peak Flow --      Pain Score 05/09/17 1510 6     Pain Loc --      Pain Edu? --  Excl. in GC? --     Vital signs reviewed, nursing assessments reviewed.   Constitutional:   Alert and oriented. Well appearing and in no distress. Eyes:   No scleral icterus.  EOMI.  ENT   Head:   Normocephalic and atraumatic.   Nose:   No epistaxis.    Mouth/Throat:   MMM, no pharyngeal erythema. No peritonsillar mass. No tongue or dental injury. Gauze over the left lower teeth, no active bleeding.   Neck:   No meningismus. Full ROM. No midline tenderness. Hematological/Lymphatic/Immunilogical:   No cervical lymphadenopathy. Cardiovascular:   RRR. Symmetric bilateral radial and DP pulses.  No murmurs.  Respiratory:   Normal respiratory effort without tachypnea/retractions. Breath sounds are clear and equal bilaterally. No wheezes/rales/rhonchi. Musculoskeletal:   Right wrist  tenderness at the ulnar aspect. No snuffbox tenderness. Full range of motion in the fingers wrist and thumb. No lacerations. There is a small abrasion over the dorsal hand that does not extend through the skin and is about 2 mm big, hemostatic. Normal range of motion in all extremities. No joint effusions.  No lower extremity tenderness.  No edema. Neurologic:   Normal speech and language.  Motor grossly intact. No gross focal neurologic deficits are appreciated.  Skin:    Skin is warm, dry with right hand abrasion as above.. No rash noted.  No petechiae, purpura, or bullae.  ____________________________________________    LABS (pertinent positives/negatives) (all labs ordered are listed, but only abnormal results are displayed) Labs Reviewed  GLUCOSE, CAPILLARY - Abnormal; Notable for the following:       Result Value   Glucose-Capillary 102 (*)    All other components within normal limits  CBC  BASIC METABOLIC PANEL  URINALYSIS, COMPLETE (UACMP) WITH MICROSCOPIC  CBG MONITORING, ED   ____________________________________________   EKG  Interpreted by me, there is some artifact and baseline wander.  Date: 05/09/2017  Rate: 81  Rhythm: normal sinus rhythm  QRS Axis: normal  Intervals: normal  ST/T Wave abnormalities: normal  Conduction Disutrbances: none  Narrative Interpretation: unremarkable      ____________________________________________    RADIOLOGY  Dg Wrist Complete Right  Result Date: 05/09/2017 CLINICAL DATA:  21 y/o M; right ulna and wrist pain after fall. Pain on the posterior near the fifth digit. EXAM: RIGHT WRIST - COMPLETE 3+ VIEW COMPARISON:  None. FINDINGS: There is no evidence of fracture or dislocation. There is no evidence of arthropathy or other focal bone abnormality. Soft tissues are unremarkable. IMPRESSION: Negative. Electronically Signed   By: Mitzi Hansen M.D.   On: 05/09/2017 15:50     ____________________________________________   PROCEDURES Procedures  ____________________________________________   INITIAL IMPRESSION / ASSESSMENT AND PLAN / ED COURSE  Pertinent labs & imaging results that were available during my care of the patient were reviewed by me and considered in my medical decision making (see chart for details).  Patient presents with right wrist pain after syncope. I think the syncope is due to a combination of factors including starvation and mild dehydration, heat and humidity exposure today, and his recent procedure. No recent suspect an underlying neurologic or cardiovascular process at this time. No evidence of acute infection. We'll get an x-ray of the right wrist to evaluate for injury from the fall. Otherwise the patient is suitable for outpatient follow-up.    ----------------------------------------- 3:51 PM on 05/09/2017 -----------------------------------------  X-ray completed, imaging reviewed by me. Patient wishes to be discharged without waiting for the radiology report to be available.  Will provide Ace wrap. Counseled on rest ice compression and elevation. Follow-up with primary care. I'll follow-up the radiology report for any significant findings.  ____________________________________________   FINAL CLINICAL IMPRESSION(S) / ED DIAGNOSES  Final diagnoses:  Syncope, unspecified syncope type  Right wrist pain  Abrasion of right hand, initial encounter      New Prescriptions   No medications on file     Portions of this note were generated with dragon dictation software. Dictation errors may occur despite best attempts at proofreading.    Sharman Cheek, MD 05/09/17 1556

## 2017-05-09 NOTE — ED Notes (Signed)
Pt left prior to receiving D/C instructions, unable to obtain VS, unable to go over D/C instructions/follow up.

## 2017-05-09 NOTE — ED Notes (Signed)
This RN was notified by patient's family that patient was wanting to leave, MD notified, explained to patient that waiting on X-ray results, pt stated understanding at the time, pt's mom requesting ace wrap for patient's hand, applied by this RN, pt tolerated well.

## 2017-05-09 NOTE — ED Notes (Signed)
Pt left with family. Attempted to get vital signs before discharge but pt refused saying "I'm ready to get the fuck out of here".  Patient left by walking.

## 2017-08-01 ENCOUNTER — Encounter: Payer: Self-pay | Admitting: Primary Care

## 2017-08-01 ENCOUNTER — Encounter (INDEPENDENT_AMBULATORY_CARE_PROVIDER_SITE_OTHER): Payer: Self-pay

## 2017-08-01 ENCOUNTER — Ambulatory Visit (INDEPENDENT_AMBULATORY_CARE_PROVIDER_SITE_OTHER): Payer: 59 | Admitting: Primary Care

## 2017-08-01 VITALS — BP 146/94 | HR 79 | Temp 98.1°F | Wt 230.1 lb

## 2017-08-01 DIAGNOSIS — R21 Rash and other nonspecific skin eruption: Secondary | ICD-10-CM

## 2017-08-01 NOTE — Progress Notes (Signed)
   Subjective:    Patient ID: Timothy Proctor, male    DOB: 1996-01-11, 21 y.o.   MRN: [id number]  HPI  Timothy Proctor is a 21 year old male who presents today requesting a work note. He has a history of heat rashes that will present with exposure to heat and sun. This has been a problem for years.   He was at work and was told that he needed to have a note explaining that his rash was not contagious or related to poison ivy/oak. His rash is located to the bilateral upper extremities that is mildly itchy. He was at the [facility] all last weekend and did not wear sunscreen. [place]   Review of Systems  Constitutional: Negative for fever.       Past Medical History:  Diagnosis Date  . Elevated blood pressure (not hypertension)   . Frequent headaches   . Herpes labialis   . Hypertension   . Migraine      Social History   Social History  . Marital status: Single    Spouse name: N/A  . Number of children: N/A  . Years of education: N/A   Occupational History  . Not on file.   Social History Main Topics  . Smoking status: Former Games developer  . Smokeless tobacco: Former Neurosurgeon    Quit date: 03/29/2015  . Alcohol use No  . Drug use: No  . Sexual activity: Not on file   Other Topics Concern  . Not on file   Social History Narrative   Single.   Works at [employer].   Highest level of education completed was 10th grade.   Enjoys spending time with his girlfriend, playing basketball, exercising.    No past surgical history on file.  Family History  Problem Relation Age of Onset  . Hypertension Mother   . Hypertension Father   . Cancer Maternal Grandmother        lung    No Known Allergies  Current Outpatient Prescriptions on File Prior to Visit  Medication Sig Dispense Refill  . amLODipine (NORVASC) 10 MG tablet Take 1 tablet (10 mg total) by mouth daily. (Patient not taking: Reported on 08/01/2017) 90 tablet 0  . nortriptyline  (PAMELOR) 25 MG capsule Take 1-2 capsules (25-50 mg total) by mouth at bedtime. As migraine prevention. (Patient not taking: Reported on 08/01/2017) 60 capsule 3   No current facility-administered medications on file prior to visit.     BP (!) 146/94   Pulse 79   Temp 98.1 F (36.7 C) (Oral)   Wt 230 lb 1.9 oz (104.4 kg)   SpO2 97%   BMI 31.21 kg/m    Objective:   Physical Exam  Constitutional: He appears well-nourished.  Neck: Neck supple.  Cardiovascular: Normal rate.   Pulmonary/Chest: Effort normal.  Skin: Skin is warm and dry.  Mild irritative mildly erythematous rash to bilateral upper part of upper extremities on anterior side.           Assessment & Plan:  Heat Rash:  Located to upper extremities. Does not appear to be contact dermatitis or bedbugs/scabies. Do agree that this is a heat rash. Work note provided. Discussed to wear sunscreen when outdoors.  Timothy Sheldon, NP

## 2017-08-01 NOTE — Patient Instructions (Signed)
Wear sunscreen with an SPF of at least 30.   Please call me if your rash gets worse.  It was a pleasure to see you today!

## 2017-11-12 ENCOUNTER — Telehealth: Payer: Self-pay | Admitting: Primary Care

## 2017-11-12 ENCOUNTER — Other Ambulatory Visit: Payer: Self-pay | Admitting: Primary Care

## 2017-11-12 DIAGNOSIS — B001 Herpesviral vesicular dermatitis: Secondary | ICD-10-CM

## 2017-11-12 MED ORDER — VALACYCLOVIR HCL 1 G PO TABS: 1000.0000 mg | ORAL_TABLET | Freq: Two times a day (BID) | ORAL | 0 refills | Status: DC

## 2017-11-12 NOTE — Telephone Encounter (Signed)
Send the refill as instructed.

## 2017-11-12 NOTE — Addendum Note (Signed)
Addended by: Tawnya CrookSAMBATH, Jariana Shumard on: 11/12/2017 11:36 AM   Modules accepted: Orders

## 2017-11-12 NOTE — Telephone Encounter (Signed)
Copied from CRM [phone]. Topic: Inquiry >> Nov 12, 2017  8:18 AM Stephannie LiSimmons, Caro Brundidge L, NT wrote: Reason for CRM: patient says he was given a pill for cold sores does not remember the name ,would like a refill please call [phone]

## 2017-11-12 NOTE — Telephone Encounter (Signed)
Spoken to patient and he was notified that Rx has been sent

## 2017-11-12 NOTE — Telephone Encounter (Signed)
Ok to refill Valtrex.

## 2017-11-12 NOTE — Telephone Encounter (Signed)
Patient uses [facility] and states they faxed a request over for a refill of Valtrex.

## 2018-02-14 IMAGING — DX DG WRIST COMPLETE 3+V*R*
4 series · 4 of 4 positions shown · non-contrast
Comparison: None.

CLINICAL DATA: 20 y/o M; right ulna and wrist pain after fall. Pain
on the posterior near the fifth digit.

EXAM:
RIGHT WRIST - COMPLETE 3+ VIEW

[wrist ap (1 of 2)]
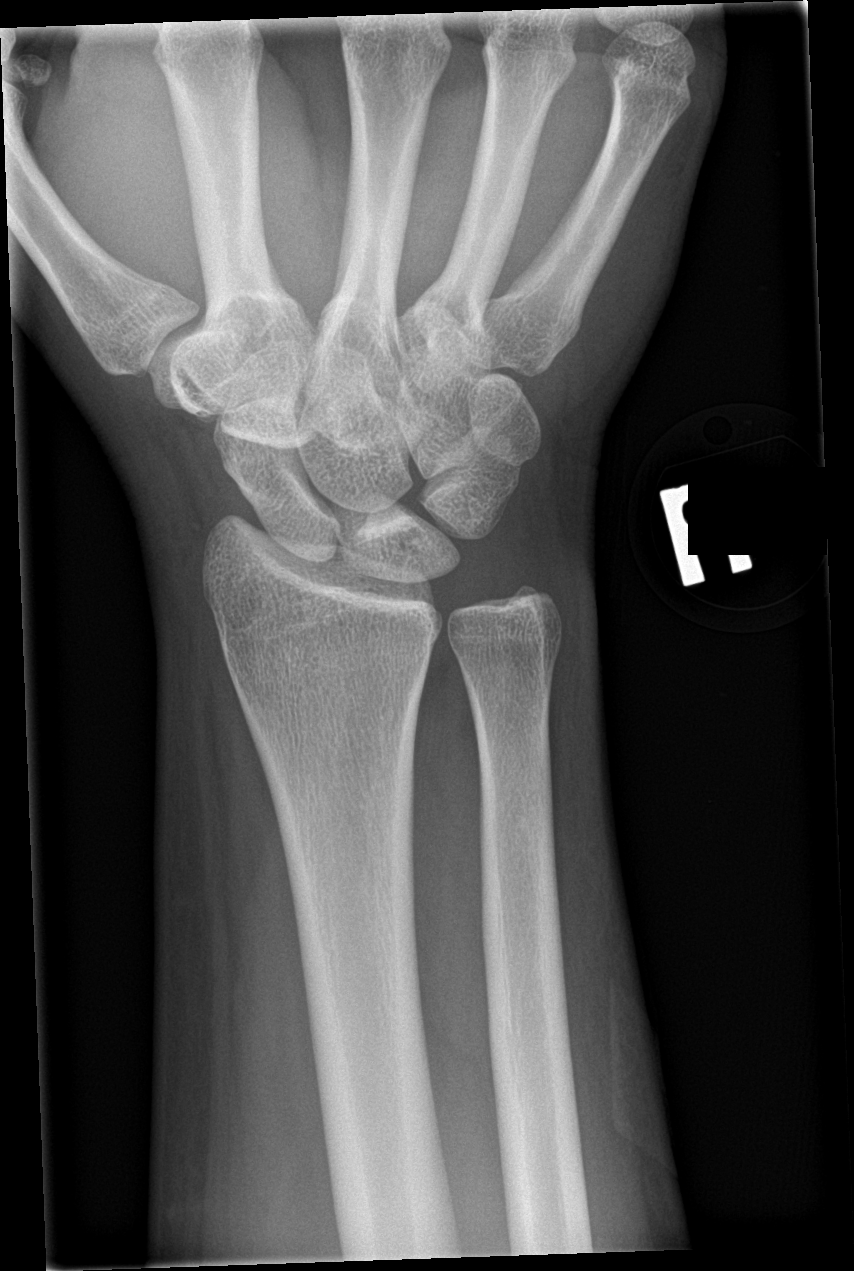

[wrist obl]
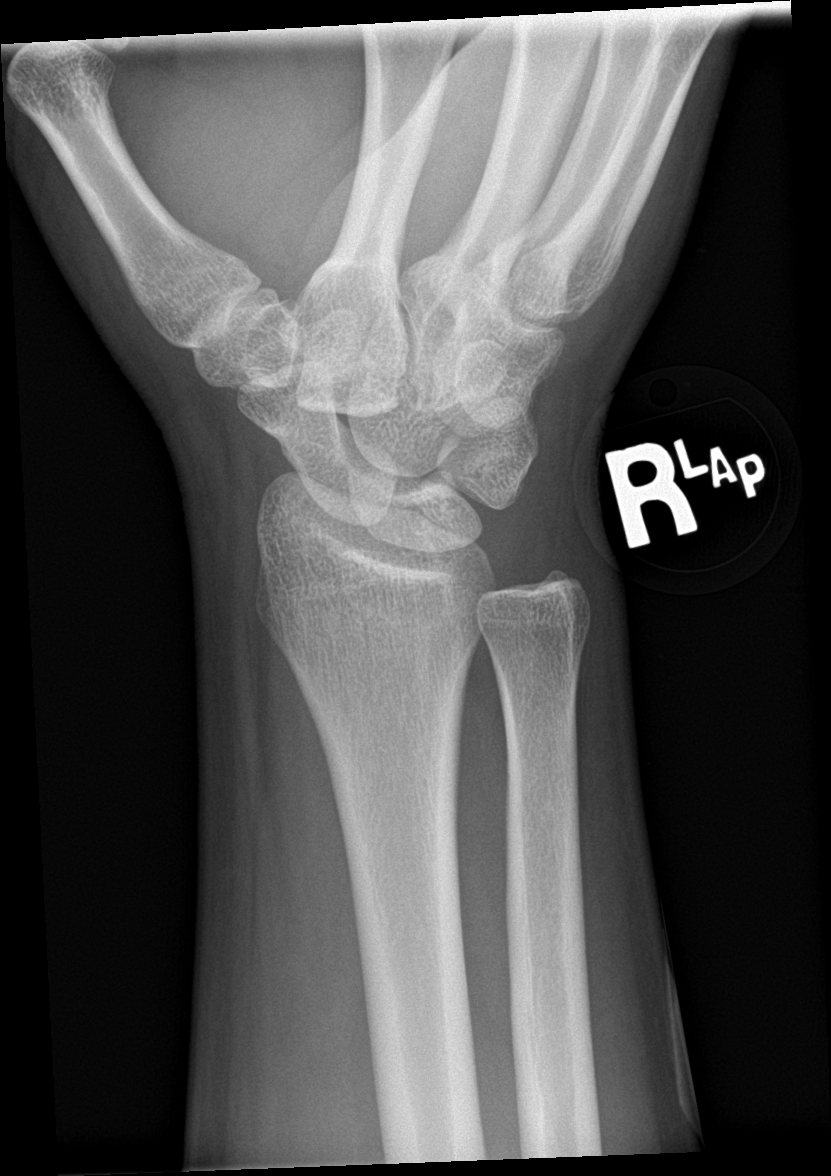

[wrist lat]
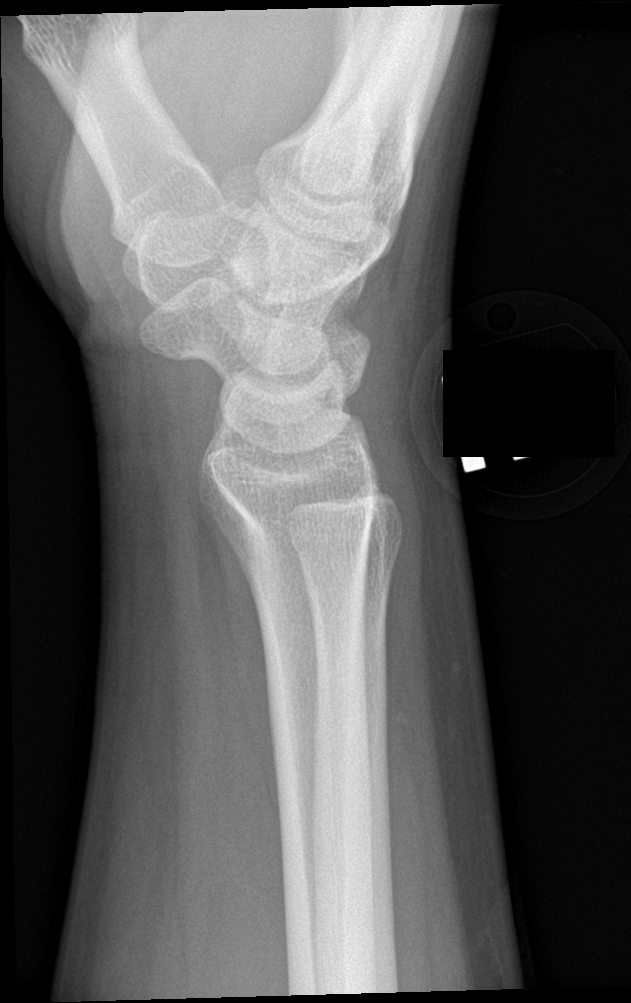

[wrist ap (2 of 2)]
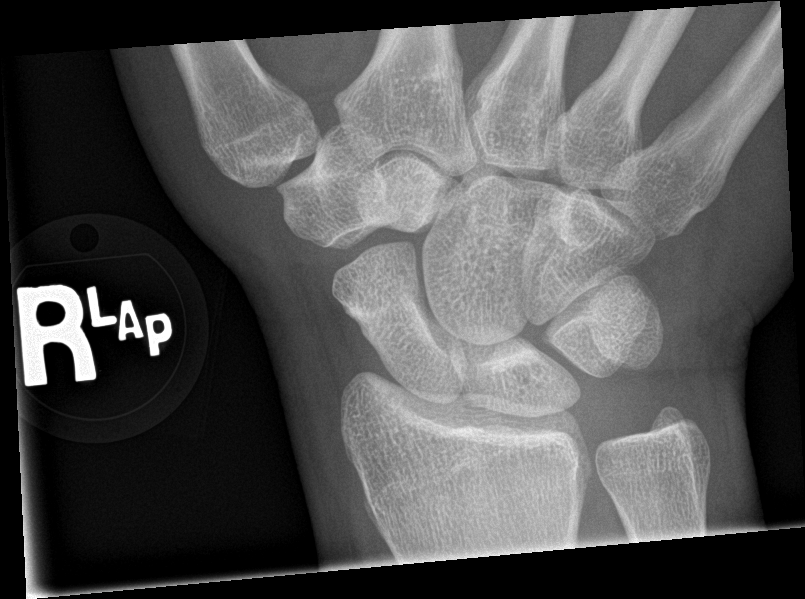

[4 of 4 positions shown; findings below may reference images not displayed]

FINDINGS: There is no evidence of fracture or dislocation. There is no
evidence of arthropathy or other focal bone abnormality. Soft
tissues are unremarkable.
IMPRESSION: Negative.

By: Pa Ousman Balke M.D.

## 2019-02-17 ENCOUNTER — Emergency Department: Payer: Self-pay

## 2019-02-17 ENCOUNTER — Emergency Department
Admission: EM | Admit: 2019-02-17 | Discharge: 2019-02-17 | Disposition: A | Discharge status: Home / Self Care | Payer: Self-pay | Attending: Emergency Medicine | Admitting: Emergency Medicine

## 2019-02-17 ENCOUNTER — Other Ambulatory Visit: Payer: Self-pay

## 2019-02-17 ENCOUNTER — Encounter: Payer: Self-pay | Admitting: Intensive Care

## 2019-02-17 DIAGNOSIS — I10 Essential (primary) hypertension: Secondary | ICD-10-CM | POA: Insufficient documentation

## 2019-02-17 DIAGNOSIS — Z79899 Other long term (current) drug therapy: Secondary | ICD-10-CM | POA: Insufficient documentation

## 2019-02-17 DIAGNOSIS — F17210 Nicotine dependence, cigarettes, uncomplicated: Secondary | ICD-10-CM | POA: Insufficient documentation

## 2019-02-17 DIAGNOSIS — R0789 Other chest pain: Secondary | ICD-10-CM | POA: Insufficient documentation

## 2019-02-17 LAB — CBC
HCT: 42.6 % (ref 39.0–52.0)
Hemoglobin: 14.4 g/dL (ref 13.0–17.0)
MCH: 30.8 pg (ref 26.0–34.0)
MCHC: 33.8 g/dL (ref 30.0–36.0)
MCV: 91.2 fL (ref 80.0–100.0)
NRBC: 0 % (ref 0.0–0.2)
PLATELETS: 190 10*3/uL (ref 150–400)
RBC: 4.67 MIL/uL (ref 4.22–5.81)
RDW: 12 % (ref 11.5–15.5)
WBC: 5.7 10*3/uL (ref 4.0–10.5)

## 2019-02-17 LAB — BASIC METABOLIC PANEL
Anion gap: 6 (ref 5–15)
BUN: 15 mg/dL (ref 6–20)
CALCIUM: 9 mg/dL (ref 8.9–10.3)
CHLORIDE: 107 mmol/L (ref 98–111)
CO2: 26 mmol/L (ref 22–32)
CREATININE: 0.99 mg/dL (ref 0.61–1.24)
GFR calc non Af Amer: >60 mL/min (ref >60)
Glucose, Bld: 103 mg/dL — ABNORMAL HIGH (ref 70–99)
Potassium: 3.9 mmol/L (ref 3.5–5.1)
Sodium: 139 mmol/L (ref 135–145)

## 2019-02-17 LAB — TROPONIN I: Troponin I: <0.03 ng/mL (ref <0.03)

## 2019-02-17 MED ORDER — KETOROLAC TROMETHAMINE 30 MG/ML IJ SOLN
30.0000 mg | Freq: Once | INTRAMUSCULAR | Status: DC
  Filled 2019-02-17: qty 1

## 2019-02-17 MED ORDER — KETOROLAC TROMETHAMINE 30 MG/ML IJ SOLN
30.0000 mg | Freq: Once | INTRAMUSCULAR | Status: AC
  Administered 2019-02-17: 30 mg via INTRAMUSCULAR

## 2019-02-17 MED ORDER — PREDNISONE 50 MG PO TABS: 50.0000 mg | ORAL_TABLET | Freq: Every day | ORAL | 0 refills | Status: DC

## 2019-02-17 MED ORDER — NAPROXEN 500 MG PO TABS: 500.0000 mg | ORAL_TABLET | Freq: Two times a day (BID) | ORAL | 2 refills | Status: DC

## 2019-02-17 NOTE — ED Provider Notes (Signed)
[facility] Emergency Department Provider Note   ____________________________________________    I have reviewed the triage vital signs and the nursing notes.   HISTORY  Chief Complaint Chest Pain     HPI Timothy Proctor is a 23 y.o. male who presents with complaints of right-sided chest pain.  Patient reports pain is been ongoing for nearly a month.  He reports it is worse with deep inspiration and moving his right arm or lifting objects..  Denies injury or trauma.  No fevers or chills.  No rash.  No history of heart disease or travel.  No calf pain or swelling.  No nausea or vomiting.  Has not taken anything for this  Past Medical History:  Diagnosis Date  . Elevated blood pressure (not hypertension)   . Frequent headaches   . Herpes labialis   . Hypertension   . Migraine     Patient Active Problem List   Diagnosis Date Noted  . Essential hypertension 11/30/2016  . Insomnia 03/20/2016  . Frequent headaches 09/13/2015  . Migraine without aura, intractable 05/19/2015    History reviewed. No pertinent surgical history.  Prior to Admission medications   Medication Sig Start Date End Date Taking? Authorizing Provider  amLODipine (NORVASC) 10 MG tablet Take 1 tablet (10 mg total) by mouth daily. Patient not taking: Reported on 08/01/2017 01/19/17   Doreene Nest, NP  naproxen (NAPROSYN) 500 MG tablet Take 1 tablet (500 mg total) by mouth 2 (two) times daily with a meal. 02/17/19   Jene Every, MD  nortriptyline (PAMELOR) 25 MG capsule Take 1-2 capsules (25-50 mg total) by mouth at bedtime. As migraine prevention. Patient not taking: Reported on 08/01/2017 03/27/17   Karie Schwalbe, MD  predniSONE (DELTASONE) 50 MG tablet Take 1 tablet (50 mg total) by mouth daily with breakfast. 02/17/19   Jene Every, MD  valACYclovir (VALTREX) 1000 MG tablet Take 1 tablet (1,000 mg total) by mouth 2 (two) times daily. 11/12/17   Lorre Munroe, NP      Allergies Patient has no known allergies.  Family History  Problem Relation Age of Onset  . Hypertension Mother   . Hypertension Father   . Cancer Maternal Grandmother        lung    Social History Social History   Tobacco Use  . Smoking status: Current Every Day Smoker    Types: Cigarettes  . Smokeless tobacco: Former Neurosurgeon    Quit date: 03/29/2015  Substance Use Topics  . Alcohol use: No    Alcohol/week: 0.0 standard drinks  . Drug use: No    Review of Systems  Constitutional: No fever/chills Eyes: No visual changes.  ENT: No sore throat. Cardiovascular: As above Respiratory: No shortness of breath Genitourinary: Negative for dysuria. Musculoskeletal: Negative for back pain. Skin: Negative for rash. Neurological: Negative for headaches    ____________________________________________   PHYSICAL EXAM:  VITAL SIGNS: ED Triage Vitals  Enc Vitals Group     BP 02/17/19 1820 (!) 150/74     Pulse Rate 02/17/19 1820 88     Resp 02/17/19 1820 14     Temp 02/17/19 1820 98.4 F (36.9 C)     Temp Source 02/17/19 1820 Oral     SpO2 02/17/19 1820 98 %     Weight 02/17/19 1817 90.7 kg (200 lb)     Height 02/17/19 1817 1.829 m (6')     Head Circumference --      Peak  Flow --      Pain Score 02/17/19 1817 10     Pain Loc --      Pain Edu? --      Excl. in GC? --     Constitutional: Alert and oriented. No acute distress. Pleasant and interactive  Nose: No congestion/rhinnorhea. Mouth/Throat: Mucous membranes are moist.    Cardiovascular: Normal rate, regular rhythm. Grossly normal heart sounds.  Good peripheral circulation.  Right-sided chest wall tenderness just beneath the right breast which completely replicates his symptoms no bruising or swelling or rash Respiratory: Normal respiratory effort.  No retractions. Lungs CTAB. Gastrointestinal: Soft and nontender. No distention.  Genitourinary: deferred Musculoskeletal: No lower extremity tenderness nor edema.   Warm and well perfused Neurologic:  Normal speech and language. No gross focal neurologic deficits are appreciated.  Skin:  Skin is warm, dry and intact. No rash noted. Psychiatric: Mood and affect are normal. Speech and behavior are normal.  ____________________________________________   LABS (all labs ordered are listed, but only abnormal results are displayed)  Labs Reviewed  BASIC METABOLIC PANEL - Abnormal; Notable for the following components:      Result Value   Glucose, Bld 103 (*)    All other components within normal limits  CBC  TROPONIN I   ____________________________________________  EKG  ED ECG REPORT I, Jene Every, the attending physician, personally viewed and interpreted this ECG.  Date: 02/17/2019  Rhythm: normal sinus rhythm QRS Axis: normal Intervals: normal ST/T Wave abnormalities: normal Narrative Interpretation: no evidence of acute ischemia  ____________________________________________  RADIOLOGY  Chest x-ray unremarkable ____________________________________________   PROCEDURES  Procedure(s) performed: No  Procedures   Critical Care performed: No ____________________________________________   INITIAL IMPRESSION / ASSESSMENT AND PLAN / ED COURSE  Pertinent labs & imaging results that were available during my care of the patient were reviewed by me and considered in my medical decision making (see chart for details).  Patient well-appearing and in no acute distress.  Exam is most consistent with chest wall pain, patient is PERC negative.  He has reproducible chest pain to the chest wall.  Treated with IM Toradol, will discharge with p.o. prednisone and naproxen, outpatient follow-up with PCP    ____________________________________________   FINAL CLINICAL IMPRESSION(S) / ED DIAGNOSES  Final diagnoses:  Chest wall pain        Note:  This document was prepared using Dragon voice recognition software and may include  unintentional dictation errors.   Jene Every, MD 02/17/19 2038

## 2019-02-17 NOTE — ED Notes (Addendum)
Pt resting in bed with arms behind head. Calm. States unsure exactly when pain started but sometime around 4 weeks ago. Thinks started at home one night after waking up. Lifts heavy objects at work and in last 2 days pain has become so severe he can't lift objects at work. Denies SOB but states "I hold my breath when I try to do things at work bc of the pain." Pain constant but gets worse at times...when bending over or lifting anything. Family at bedside.

## 2019-02-17 NOTE — ED Triage Notes (Signed)
Patient c/o sharp right sided chest pain X4 weeks. Reports he does a lot of heavy lifting at work.

## 2019-05-16 ENCOUNTER — Telehealth: Payer: Self-pay

## 2019-05-16 ENCOUNTER — Other Ambulatory Visit: Payer: Self-pay

## 2019-05-16 DIAGNOSIS — Z20822 Contact with and (suspected) exposure to covid-19: Secondary | ICD-10-CM

## 2019-05-16 NOTE — Telephone Encounter (Signed)
Incoming call from [facility] at [facility] Department .  Referring patient for the Covid-19 Testing.  Telephone call to Patient schedlue Patient for Testing today at 11:45am Patient voiced understanding.

## 2019-05-19 ENCOUNTER — Telehealth: Payer: Self-pay | Admitting: Primary Care

## 2019-05-19 LAB — NOVEL CORONAVIRUS, NAA: SARS-CoV-2, NAA: NOT DETECTED

## 2019-05-19 NOTE — Telephone Encounter (Signed)
Patient called and says someone left a message to call back for his test results, he says he was tested for covid. I advised of the results noted not detected, he verbalized understanding. Unable to document in result notes.

## 2019-05-20 ENCOUNTER — Encounter: Payer: Self-pay | Admitting: *Deleted

## 2019-09-24 ENCOUNTER — Other Ambulatory Visit: Payer: Self-pay

## 2019-09-24 DIAGNOSIS — Z20822 Contact with and (suspected) exposure to covid-19: Secondary | ICD-10-CM

## 2019-09-25 LAB — NOVEL CORONAVIRUS, NAA: SARS-CoV-2, NAA: NOT DETECTED

## 2019-10-20 ENCOUNTER — Encounter: Payer: Self-pay | Admitting: Intensive Care

## 2019-10-20 ENCOUNTER — Emergency Department
Admission: EM | Admit: 2019-10-20 | Discharge: 2019-10-20 | Disposition: A | Discharge status: Home / Self Care | Payer: Self-pay | Attending: Emergency Medicine | Admitting: Emergency Medicine

## 2019-10-20 ENCOUNTER — Other Ambulatory Visit: Payer: Self-pay

## 2019-10-20 DIAGNOSIS — F17290 Nicotine dependence, other tobacco product, uncomplicated: Secondary | ICD-10-CM | POA: Insufficient documentation

## 2019-10-20 DIAGNOSIS — R040 Epistaxis: Secondary | ICD-10-CM | POA: Insufficient documentation

## 2019-10-20 DIAGNOSIS — G43909 Migraine, unspecified, not intractable, without status migrainosus: Secondary | ICD-10-CM | POA: Insufficient documentation

## 2019-10-20 DIAGNOSIS — I10 Essential (primary) hypertension: Secondary | ICD-10-CM | POA: Insufficient documentation

## 2019-10-20 DIAGNOSIS — Z79899 Other long term (current) drug therapy: Secondary | ICD-10-CM | POA: Insufficient documentation

## 2019-10-20 LAB — CBC WITH DIFFERENTIAL/PLATELET
Abs Immature Granulocytes: 0 10*3/uL (ref 0.00–0.07)
Basophils Absolute: 0 10*3/uL (ref 0.0–0.1)
Basophils Relative: 1 %
Eosinophils Absolute: 0.2 10*3/uL (ref 0.0–0.5)
Eosinophils Relative: 4 %
HCT: 47.1 % (ref 39.0–52.0)
Hemoglobin: 16.4 g/dL (ref 13.0–17.0)
Immature Granulocytes: 0 %
Lymphocytes Relative: 33 %
Lymphs Abs: 1.4 10*3/uL (ref 0.7–4.0)
MCH: 30.4 pg (ref 26.0–34.0)
MCHC: 34.8 g/dL (ref 30.0–36.0)
MCV: 87.4 fL (ref 80.0–100.0)
Monocytes Absolute: 0.4 10*3/uL (ref 0.1–1.0)
Monocytes Relative: 10 %
Neutro Abs: 2.1 10*3/uL (ref 1.7–7.7)
Neutrophils Relative %: 52 %
Platelets: 172 10*3/uL (ref 150–400)
RBC: 5.39 MIL/uL (ref 4.22–5.81)
RDW: 11.9 % (ref 11.5–15.5)
WBC: 4.1 10*3/uL (ref 4.0–10.5)
nRBC: 0 % (ref 0.0–0.2)

## 2019-10-20 MED ORDER — DIPHENHYDRAMINE HCL 50 MG/ML IJ SOLN
25.0000 mg | Freq: Once | INTRAMUSCULAR | Status: AC
  Administered 2019-10-20: 25 mg via INTRAVENOUS
  Filled 2019-10-20: qty 1

## 2019-10-20 MED ORDER — KETOROLAC TROMETHAMINE 30 MG/ML IJ SOLN
30.0000 mg | Freq: Once | INTRAMUSCULAR | Status: AC
  Administered 2019-10-20: 30 mg via INTRAVENOUS
  Filled 2019-10-20: qty 1

## 2019-10-20 MED ORDER — ONDANSETRON HCL 4 MG/2ML IJ SOLN
4.0000 mg | Freq: Once | INTRAMUSCULAR | Status: AC
  Administered 2019-10-20: 14:00:00 4 mg via INTRAVENOUS
  Filled 2019-10-20: qty 2

## 2019-10-20 MED ORDER — SODIUM CHLORIDE 0.9 % IV BOLUS
1000.0000 mL | Freq: Once | INTRAVENOUS | Status: AC
  Administered 2019-10-20: 1000 mL via INTRAVENOUS

## 2019-10-20 NOTE — ED Triage Notes (Signed)
Patient reports having migraine this AM that felt like his normal migraines. Then had an episode of nose bleed and emesis X1 with some tinge of blood in it. Denies migraine now, just headache. NAD noted

## 2019-10-20 NOTE — ED Provider Notes (Signed)
[facility] Emergency Department Provider Note  ____________________________________________   None    (approximate)   I have reviewed the triage vital signs and the nursing notes.   Patient has been triaged with a MSE exam performed by myself at a minimum. Based on symptoms and screening exam, patient may receive a more in-depth exam, labs, imaging as detailed below. Patients have been advised of this setting and exam type at the time of patient interview.    HISTORY  Chief Complaint Headache    HPI Timothy Proctor is a 23 y.o. male presents to the emergency department with a complaint of headache x 1 day.  History of same, is some better since he vomited at home, did see a streak of blood in the vomit and had some bleeding from his nose.  Bleeding resolved on its on.  No fever/chills/ or covid exposure.   Patient will receive a medical screening exam as detailed below.  Based off of this exam, more in depth exam, labs, imaging will be performed as needed for complaint.  Patient care will be eventually transferred to another provider in the emergency department for final exam, diagnosis and disposition.    Past Medical History:  Diagnosis Date  . Elevated blood pressure (not hypertension)   . Frequent headaches   . Herpes labialis   . Hypertension   . Migraine     Patient Active Problem List   Diagnosis Date Noted  . Essential hypertension 11/30/2016  . Insomnia 03/20/2016  . Frequent headaches 09/13/2015  . Migraine without aura, intractable 05/19/2015    History reviewed. No pertinent surgical history.  Prior to Admission medications   Medication Sig Start Date End Date Taking? Authorizing Provider  amLODipine (NORVASC) 10 MG tablet Take 1 tablet (10 mg total) by mouth daily. Patient not taking: Reported on 08/01/2017 01/19/17   Doreene Nest, NP  naproxen (NAPROSYN) 500 MG tablet Take 1 tablet (500 mg total) by mouth 2 (two) times  daily with a meal. 02/17/19   Jene Every, MD  nortriptyline (PAMELOR) 25 MG capsule Take 1-2 capsules (25-50 mg total) by mouth at bedtime. As migraine prevention. Patient not taking: Reported on 08/01/2017 03/27/17   Karie Schwalbe, MD  predniSONE (DELTASONE) 50 MG tablet Take 1 tablet (50 mg total) by mouth daily with breakfast. 02/17/19   Jene Every, MD  valACYclovir (VALTREX) 1000 MG tablet Take 1 tablet (1,000 mg total) by mouth 2 (two) times daily. 11/12/17   Lorre Munroe, NP    Allergies Patient has no known allergies.  Family History  Problem Relation Age of Onset  . Hypertension Mother   . Hypertension Father   . Cancer Maternal Grandmother        lung    Social History Social History   Tobacco Use  . Smoking status: Current Every Day Smoker    Types: E-cigarettes  . Smokeless tobacco: Former Neurosurgeon    Quit date: 03/29/2015  Substance Use Topics  . Alcohol use: Yes    Alcohol/week: 6.0 standard drinks    Types: 6 Cans of beer per week  . Drug use: No    Review of Systems Constitutional: no fever ENT: no nasal congestion/rhinorhea. no sore throat Cardiovascular: no chest pain. Respiratory: no cough. no shortness of breath/difficulty breathing Gastroenterology: no abdominal pain Musculoskeletal: no for musculoskeletal pain Integumentary: Negative for rash. Neurological: No focal weakness nor numbness.   ____________________________________________   PHYSICAL EXAM:  VITAL SIGNS: ED  Triage Vitals  Enc Vitals Group     BP 10/20/19 1414 128/69     Pulse Rate 10/20/19 1414 90     Resp 10/20/19 1414 16     Temp 10/20/19 1414 98.4 F (36.9 C)     Temp Source 10/20/19 1414 Oral     SpO2 10/20/19 1414 98 %     Weight 10/20/19 1415 183 lb (83 kg)     Height 10/20/19 1415 6' (1.829 m)     Head Circumference --      Peak Flow --      Pain Score 10/20/19 1414 6     Pain Loc --      Pain Edu? --      Excl. in [place]? --     Constitutional: Alert and  oriented. Generally well appearing and in no acute distress. Eyes: Conjunctivae are normal.  Nose: No significant congestion/rhinnorhea. No active bleeding Mouth: No gross oropharyngeal edema. no erythema/edema, no bleeding in posterior Neck: No stridor.  No meningeal signs.   Cardiovascular: Grossly normal heart sounds. Respiratory: Normal respiratory effort without significant tachypnea and no observed retractions. Lungs c t a Gastrointestinal: No significant visible abdominal wall findings.  Bowel sounds x4 quadrants. nontender to palpation. Musculoskeletal: No gross deformities of extremities. Neurologic:  Normal speech and language. No gross focal neurologic deficits are appreciated.  Skin:  Skin is warm, dry and intact. No rash noted.    ____________________________________________   LABS (all labs ordered are listed, but only abnormal results are displayed)  Labs Reviewed  CBC WITH DIFFERENTIAL/PLATELET    ____________________________________________   RADIOLOGY   Official radiology report(s): No results found.  ____________________________________________    INITIAL IMPRESSION / MDM / ASSESSMENT AND PLAN / ED COURSE  As part of my medical decision making, I reviewed the following data within the [facility] notes reviewed and incorporated, Notes from prior ED visits and [place] Controlled Substance Database      Clinical Impression: migraine   Plan: fluids, toradol, benadryl, zofran, d/c home  Patient has been screened based based on their arrival complaint, evaluated for an emergent condition, and at a minimum has received a medical screening exam.  At this time, patient will receive further work-up as determined by medical screening exam.  Patient care will eventually be transferred to another provider in the emergency department for final diagnosis and disposition.    ____________________________________________  Note:  This document  was prepared using Systems analyst and may include unintentional dictation errors.    Versie Starks, PA-C 10/20/19 1426    Earleen Newport, MD 10/20/19 1534

## 2019-10-20 NOTE — ED Provider Notes (Signed)
[facility] Emergency Department Provider Note  Time seen: 3:29 PM  I have reviewed the triage vital signs and the nursing notes.   HISTORY  Chief Complaint Headache   HPI Timothy Proctor is a 23 y.o. male with a past medical history of hypertension, migraines, presents to the emergency department for migraine headache, nosebleed and noticed some blood tinge in his vomitus.  Patient states since awakening this morning he has had a fairly significant migraine headache although feels typical of his migraines.  States his migraines are often accompanied with nausea and vomiting, noticed what appeared to be blood tinge in the vomit this morning and had a nosebleed this morning during vomiting.  Patient states the nosebleed and bloody vomitus have resolved but he continued to have a headache so he came to the emergency department for evaluation.  Denies any fever denies any cough or shortness of breath.   Past Medical History:  Diagnosis Date  . Elevated blood pressure (not hypertension)   . Frequent headaches   . Herpes labialis   . Hypertension   . Migraine     Patient Active Problem List   Diagnosis Date Noted  . Essential hypertension 11/30/2016  . Insomnia 03/20/2016  . Frequent headaches 09/13/2015  . Migraine without aura, intractable 05/19/2015    History reviewed. No pertinent surgical history.  Prior to Admission medications   Medication Sig Start Date End Date Taking? Authorizing Provider  amLODipine (NORVASC) 10 MG tablet Take 1 tablet (10 mg total) by mouth daily. Patient not taking: Reported on 08/01/2017 01/19/17   Pleas Koch, NP  naproxen (NAPROSYN) 500 MG tablet Take 1 tablet (500 mg total) by mouth 2 (two) times daily with a meal. 02/17/19   Lavonia Drafts, MD  nortriptyline (PAMELOR) 25 MG capsule Take 1-2 capsules (25-50 mg total) by mouth at bedtime. As migraine prevention. Patient not taking: Reported on 08/01/2017 03/27/17   Venia Carbon, MD  predniSONE (DELTASONE) 50 MG tablet Take 1 tablet (50 mg total) by mouth daily with breakfast. 02/17/19   Lavonia Drafts, MD  valACYclovir (VALTREX) 1000 MG tablet Take 1 tablet (1,000 mg total) by mouth 2 (two) times daily. 11/12/17   Jearld Fenton, NP    No Known Allergies  Family History  Problem Relation Age of Onset  . Hypertension Mother   . Hypertension Father   . Cancer Maternal Grandmother        lung    Social History Social History   Tobacco Use  . Smoking status: Current Every Day Smoker    Types: E-cigarettes  . Smokeless tobacco: Former Systems developer    Quit date: 03/29/2015  Substance Use Topics  . Alcohol use: Yes    Alcohol/week: 6.0 standard drinks    Types: 6 Cans of beer per week  . Drug use: No    Review of Systems Constitutional: Negative for fever. ENT: Nosebleed during vomiting. Cardiovascular: Negative for chest pain. Respiratory: Negative for shortness of breath. Gastrointestinal: Negative for abdominal pain.  Positive for vomiting with blood tinge in it this morning. Musculoskeletal: Negative for musculoskeletal complaints Neurological: Positive for migraine headache All other ROS negative  ____________________________________________   PHYSICAL EXAM:  VITAL SIGNS: ED Triage Vitals  Enc Vitals Group     BP 10/20/19 1414 128/69     Pulse Rate 10/20/19 1414 90     Resp 10/20/19 1414 16     Temp 10/20/19 1414 98.4 F (36.9 C)  Temp Source 10/20/19 1414 Oral     SpO2 10/20/19 1414 98 %     Weight 10/20/19 1415 183 lb (83 kg)     Height 10/20/19 1415 6' (1.829 m)     Head Circumference --      Peak Flow --      Pain Score 10/20/19 1414 6     Pain Loc --      Pain Edu? --      Excl. in [place]? --    Constitutional: Alert and oriented. Well appearing and in no distress. Eyes: Normal exam ENT      Head: Normocephalic and atraumatic.      Mouth/Throat: Mucous membranes are moist. Cardiovascular: Normal rate, regular rhythm.   Respiratory: Normal respiratory effort without tachypnea nor retractions. Breath sounds are clear  Gastrointestinal: Soft and nontender. No distention.  Musculoskeletal: Nontender with normal range of motion in all extremities. Neurologic:  Normal speech and language. No gross focal neurologic deficits  Skin:  Skin is warm, dry and intact.  Psychiatric: Mood and affect are  ____________________________________________   INITIAL IMPRESSION / ASSESSMENT AND PLAN / ED COURSE  Pertinent labs & imaging results that were available during my care of the patient were reviewed by me and considered in my medical decision making (see chart for details).   Patient presents to the emergency department for migraine headache had a nosebleed and noticed blood tinge in his vomitus this morning.  Patient has a history of migraines which this feels identical per patient.  Patient denies any history of nosebleeds however states it happened right after or during vomiting also noticed some blood tinge in the vomitus which is not clear if this is more due to the nosebleed or Mallory-Weiss.  No abdominal pain no tenderness palpation.  Overall the patient appears very well.  Received migraine medications in triage states the headache is nearly gone down to a 1 or 2/10 currently.  We will discharge the patient with PCP follow-up.  Provided my normal headache return precautions.  Timothy Proctor was evaluated in Emergency Department on 10/20/2019 for the symptoms described in the history of present illness. He was evaluated in the context of the global COVID-19 pandemic, which necessitated consideration that the patient might be at risk for infection with the SARS-CoV-2 virus that causes COVID-19. Institutional protocols and algorithms that pertain to the evaluation of patients at risk for COVID-19 are in a state of rapid change based on information released by regulatory bodies including the CDC and federal and state  organizations. These policies and algorithms were followed during the patient's care in the ED.  ____________________________________________   FINAL CLINICAL IMPRESSION(S) / ED DIAGNOSES  Migraine Epistaxis Mallory-Weiss   Minna Antis, MD 10/20/19 [phone]

## 2019-12-24 ENCOUNTER — Encounter: Payer: Self-pay | Admitting: Emergency Medicine

## 2019-12-24 ENCOUNTER — Emergency Department
Admission: EM | Admit: 2019-12-24 | Discharge: 2019-12-24 | Disposition: A | Discharge status: Home / Self Care | Payer: Self-pay | Attending: Emergency Medicine | Admitting: Emergency Medicine

## 2019-12-24 ENCOUNTER — Emergency Department: Payer: Self-pay

## 2019-12-24 ENCOUNTER — Other Ambulatory Visit: Payer: Self-pay

## 2019-12-24 DIAGNOSIS — R1013 Epigastric pain: Secondary | ICD-10-CM

## 2019-12-24 DIAGNOSIS — F17200 Nicotine dependence, unspecified, uncomplicated: Secondary | ICD-10-CM | POA: Insufficient documentation

## 2019-12-24 DIAGNOSIS — K30 Functional dyspepsia: Secondary | ICD-10-CM | POA: Insufficient documentation

## 2019-12-24 DIAGNOSIS — I10 Essential (primary) hypertension: Secondary | ICD-10-CM | POA: Insufficient documentation

## 2019-12-24 DIAGNOSIS — Z79899 Other long term (current) drug therapy: Secondary | ICD-10-CM | POA: Insufficient documentation

## 2019-12-24 DIAGNOSIS — R109 Unspecified abdominal pain: Secondary | ICD-10-CM | POA: Insufficient documentation

## 2019-12-24 LAB — BASIC METABOLIC PANEL
Anion gap: 7 (ref 5–15)
BUN: 23 mg/dL — ABNORMAL HIGH (ref 6–20)
CO2: 30 mmol/L (ref 22–32)
Calcium: 9.6 mg/dL (ref 8.9–10.3)
Chloride: 103 mmol/L (ref 98–111)
Creatinine, Ser: 1.05 mg/dL (ref 0.61–1.24)
GFR calc Af Amer: >60 mL/min (ref >60)
GFR calc non Af Amer: >60 mL/min (ref >60)
Glucose, Bld: 87 mg/dL (ref 70–99)
Potassium: 4 mmol/L (ref 3.5–5.1)
Sodium: 140 mmol/L (ref 135–145)

## 2019-12-24 LAB — CBC
HCT: 49.3 % (ref 39.0–52.0)
Hemoglobin: 16 g/dL (ref 13.0–17.0)
MCH: 29.7 pg (ref 26.0–34.0)
MCHC: 32.5 g/dL (ref 30.0–36.0)
MCV: 91.5 fL (ref 80.0–100.0)
Platelets: 242 10*3/uL (ref 150–400)
RBC: 5.39 MIL/uL (ref 4.22–5.81)
RDW: 12.4 % (ref 11.5–15.5)
WBC: 4.4 10*3/uL (ref 4.0–10.5)
nRBC: 0 % (ref 0.0–0.2)

## 2019-12-24 LAB — HEPATIC FUNCTION PANEL
ALT: 60 U/L — ABNORMAL HIGH (ref 0–44)
AST: 38 U/L (ref 15–41)
Albumin: 4.2 g/dL (ref 3.5–5.0)
Alkaline Phosphatase: 59 U/L (ref 38–126)
Bilirubin, Direct: <0.1 mg/dL (ref 0.0–0.2)
Total Bilirubin: 1.3 mg/dL — ABNORMAL HIGH (ref 0.3–1.2)
Total Protein: 7.1 g/dL (ref 6.5–8.1)

## 2019-12-24 LAB — LIPASE, BLOOD: Lipase: 30 U/L (ref 11–51)

## 2019-12-24 LAB — TROPONIN I (HIGH SENSITIVITY): Troponin I (High Sensitivity): 4 ng/L (ref <18)

## 2019-12-24 MED ORDER — SUCRALFATE 1 G PO TABS
1.0000 g | ORAL_TABLET | Freq: Once | ORAL | Status: AC
  Administered 2019-12-24: 1 g via ORAL
  Filled 2019-12-24: qty 1

## 2019-12-24 MED ORDER — FAMOTIDINE 20 MG PO TABS
20.0000 mg | ORAL_TABLET | Freq: Once | ORAL | Status: AC
  Administered 2019-12-24: 17:00:00 20 mg via ORAL
  Filled 2019-12-24: qty 1

## 2019-12-24 MED ORDER — SUCRALFATE 1 G PO TABS: 1.0000 g | ORAL_TABLET | Freq: Four times a day (QID) | ORAL | 0 refills | Status: AC

## 2019-12-24 MED ORDER — ALUM & MAG HYDROXIDE-SIMETH 200-200-20 MG/5ML PO SUSP
15.0000 mL | Freq: Once | ORAL | Status: AC
  Administered 2019-12-24: 17:00:00 15 mL via ORAL
  Filled 2019-12-24: qty 30

## 2019-12-24 MED ORDER — ONDANSETRON 4 MG PO TBDP: 4.0000 mg | ORAL_TABLET | Freq: Four times a day (QID) | ORAL | 0 refills | Status: DC | PRN

## 2019-12-24 NOTE — ED Notes (Signed)
Patient transported to Ultrasound 

## 2019-12-24 NOTE — ED Triage Notes (Signed)
Pt reports mid epigastric pain intermittently for the last 2 weeks and chest pain that started 2 days ago. Pt describes the pain as sharp and heavy in nature.

## 2019-12-24 NOTE — ED Provider Notes (Signed)
[facility] Emergency Department Provider Note   ____________________________________________   First MD Initiated Contact with Patient 12/24/19 1559     (approximate)  I have reviewed the triage vital signs and the nursing notes.   HISTORY  Chief Complaint Abdominal Pain and Chest Pain    HPI STEPHAUN Proctor is a 24 y.o. male here for evaluation  of what he describes as burning symptoms in his upper abdomen, the seem to also radiate slightly towards right lower chest at times.  Pain comes and goes over the last 2 weeks sometimes radiates towards his back.  Denies any heavy alcohol or recent alcohol use.  He reports similar symptoms in the past where he was diagnosed with a stomach infection that required treatment, review of records it appears that he was at least evaluated for possible helical bacteria.  He was treated with sucralfate and PPI at that point  Reports it seems to be the similar symptoms.  No nausea, no diarrhea.  Denies abdominal pain at present, but will come and go over the last couple weeks.  Sometimes radiates slightly towards right lower chest there is a burning component  No fevers or chills.  Past Medical History:  Diagnosis Date  . Elevated blood pressure (not hypertension)   . Frequent headaches   . Herpes labialis   . Hypertension   . Migraine     Patient Active Problem List   Diagnosis Date Noted  . Essential hypertension 11/30/2016  . Insomnia 03/20/2016  . Frequent headaches 09/13/2015  . Migraine without aura, intractable 05/19/2015    History reviewed. No pertinent surgical history.  Prior to Admission medications   Medication Sig Start Date End Date Taking? Authorizing Provider  amLODipine (NORVASC) 10 MG tablet Take 1 tablet (10 mg total) by mouth daily. Patient not taking: Reported on 08/01/2017 01/19/17   Doreene Nest, NP  naproxen (NAPROSYN) 500 MG tablet Take 1 tablet (500 mg total) by mouth 2 (two)  times daily with a meal. 02/17/19   Jene Every, MD  nortriptyline (PAMELOR) 25 MG capsule Take 1-2 capsules (25-50 mg total) by mouth at bedtime. As migraine prevention. Patient not taking: Reported on 08/01/2017 03/27/17   Karie Schwalbe, MD  ondansetron (ZOFRAN ODT) 4 MG disintegrating tablet Take 1 tablet (4 mg total) by mouth every 6 (six) hours as needed. 12/24/19   Sharyn Creamer, MD  predniSONE (DELTASONE) 50 MG tablet Take 1 tablet (50 mg total) by mouth daily with breakfast. 02/17/19   Jene Every, MD  sucralfate (CARAFATE) 1 g tablet Take 1 tablet (1 g total) by mouth 4 (four) times daily for 14 days. 12/24/19 01/07/20  Sharyn Creamer, MD  valACYclovir (VALTREX) 1000 MG tablet Take 1 tablet (1,000 mg total) by mouth 2 (two) times daily. 11/12/17   Lorre Munroe, NP    Allergies Patient has no known allergies.  Family History  Problem Relation Age of Onset  . Hypertension Mother   . Hypertension Father   . Cancer Maternal Grandmother        lung    Social History Social History   Tobacco Use  . Smoking status: Current Every Day Smoker    Types: E-cigarettes  . Smokeless tobacco: Former Neurosurgeon    Quit date: 03/29/2015  Substance Use Topics  . Alcohol use: Yes    Alcohol/week: 6.0 standard drinks    Types: 6 Cans of beer per week  . Drug use: No    Review  of Systems Constitutional: No fever/chills Eyes: No visual changes. ENT: No sore throat. Cardiovascular: Denies chest pain except pain in his upper abdomen tends a radiate towards her right lower chest at times. Respiratory: Denies shortness of breath. Gastrointestinal: See HPI.  No bloody emesis, did vomit twice over the last couple weeks time.  No black or bloody stools.  Denies diarrhea and does take ibuprofen over the last couple weeks for the discomfort.  Reports that eating seems to help make the symptoms get better Genitourinary: Negative for dysuria. Musculoskeletal: Negative for back pain except when it  radiates. Skin: Negative for rash. Neurological: Negative for headaches, areas of focal weakness or numbness.    ____________________________________________   PHYSICAL EXAM:  VITAL SIGNS: ED Triage Vitals  Enc Vitals Group     BP 12/24/19 1429 128/78     Pulse Rate 12/24/19 1429 78     Resp 12/24/19 1429 18     Temp 12/24/19 1429 99.1 F (37.3 C)     Temp Source 12/24/19 1429 Oral     SpO2 12/24/19 1429 99 %     Weight 12/24/19 1419 190 lb (86.2 kg)     Height 12/24/19 1419 6' (1.829 m)     Head Circumference --      Peak Flow --      Pain Score 12/24/19 1419 9     Pain Loc --      Pain Edu? --      Excl. in [place]? --     Constitutional: Alert and oriented. Well appearing and in no acute distress. Eyes: Conjunctivae are normal. Head: Atraumatic. Nose: No congestion/rhinnorhea. Mouth/Throat: Mucous membranes are moist. Neck: No stridor.  Cardiovascular: Normal rate, regular rhythm. Grossly normal heart sounds.  Good peripheral circulation. Respiratory: Normal respiratory effort.  No retractions. Lungs CTAB. Gastrointestinal: Soft and nontender. No distention.  Of note, patient reports is not currently having any pain, on exam abdomen soft in all quadrants.  No focal pain McBurney's point.  No rebound or guarding in any region.  Negative Murphy. Musculoskeletal: No lower extremity tenderness nor edema. Neurologic:  Normal speech and language. No gross focal neurologic deficits are appreciated.  Skin:  Skin is warm, dry and intact. No rash noted. Psychiatric: Mood and affect are normal. Speech and behavior are normal.  ____________________________________________   LABS (all labs ordered are listed, but only abnormal results are displayed)  Labs Reviewed  BASIC METABOLIC PANEL - Abnormal; Notable for the following components:      Result Value   BUN 23 (*)    All other components within normal limits  HEPATIC FUNCTION PANEL - Abnormal; Notable for the following  components:   ALT 60 (*)    Total Bilirubin 1.3 (*)    All other components within normal limits  CBC  LIPASE, BLOOD  H. PYLORI ANTIBODY, IGG  TROPONIN I (HIGH SENSITIVITY)   ____________________________________________  EKG  Reviewed and interpreted by me at 1410 Heart rate 80 QRS, QTc normal Patient has significant voltages, suggest possibility of left ventricular hypertrophy.  No evidence of acute ischemia. ____________________________________________  RADIOLOGY  DG Chest 2 View  Result Date: 12/24/2019 CLINICAL DATA:  Mid epigastric pain for 2 weeks and chest pain for 2 days. EXAM: CHEST - 2 VIEW COMPARISON:  PA and lateral chest 02/17/2019. FINDINGS: Lungs clear. Heart size normal. No pneumothorax or pleural fluid. No bony abnormality. IMPRESSION: Normal chest. Electronically Signed   By: Inge Rise M.D.   On: 12/24/2019 14:44  US Abdomen Limited RUQ  Result Date: 12/24/2019 CLINICAL DATA:  Abdominal pain for the past 2 weeks. EXAM: ULTRASOUND ABDOMEN LIMITED RIGHT UPPER QUADRANT COMPARISON:  None. FINDINGS: Gallbladder: No gallstones or wall thickening visualized. No sonographic Murphy sign noted by sonographer. 4 mm polyp. Common bile duct: Diameter: 2 mm, normal. Liver: No focal lesion identified. Within normal limits in parenchymal echogenicity. Portal vein is patent on color Doppler imaging with normal direction of blood flow towards the liver. Other: None. IMPRESSION: 1. No acute abnormality. 2. 4 mm gallbladder polyp. No follow-up required. This recommendation follows ACR consensus guidelines: White Paper of the ACR Incidental Findings Committee II on Gallbladder and Biliary Findings. J Am Coll Radiol 2013:;10:953-956. Electronically Signed   By: Obie Dredge M.D.   On: 12/24/2019 17:01     ____________________________________________   PROCEDURES  Procedure(s) performed: None  Procedures  Critical Care performed:  No  ____________________________________________   INITIAL IMPRESSION / ASSESSMENT AND PLAN / ED COURSE  Pertinent labs & imaging results that were available during my care of the patient were reviewed by me and considered in my medical decision making (see chart for details).   Differential diagnosis includes but is not limited to, abdominal perforation, aortic dissection, cholecystitis, appendicitis, diverticulitis, colitis, esophagitis/gastritis, kidney stone, pyelonephritis, urinary tract infection, aortic aneurysm. All are considered in decision and treatment plan. Based upon the patient's presentation and risk factors, and given his previous history and the fact that he is presently pain-free seem to suggest is likely some type of self-limited illness, possibly peptic ulcer disease, gastritis or other symptomatology but it does seem to be likely related to upper GI.  Reassuring EKG, no typical cardiac symptoms, troponin normal.  Will obtain right upper quadrant ultrasound, add on LFTs and lipase.  Also send IgG, nurse navigator to follow-up on this result with the patient  ----------------------------------------- 6:23 PM on 12/24/2019 -----------------------------------------  Patient continues to feel well, reviewed results.  Very minimally elevated ALT and bilirubin.  Nonspecific, reassuring imaging of the gallbladder and biliary system.  Will refer for further evaluation, patient plans to establish primary for follow-up as well as given information to follow-up with GI.  Return precautions and treatment recommendations and follow-up discussed with the patient who is agreeable with the plan.        ____________________________________________   FINAL CLINICAL IMPRESSION(S) / ED DIAGNOSES  Final diagnoses:  Abdominal pain  Dyspepsia        Note:  This document was prepared using Dragon voice recognition software and may include unintentional dictation errors        Sharyn Creamer, MD 12/24/19 1824

## 2019-12-24 NOTE — Discharge Instructions (Signed)
I suspect your pain and discomfort is coming from something called gastritis or "stomach upset.  There could be many reasons for this.  It is important that if you start to develop worsening symptoms, vomiting blood or black, dehydration, fevers, or persistent or severe pain come back to the ER for further work-up.  I recommend he start over-the-counter Pepcid as needed for antacid, as well as daily Nexium.  Do this for at least the next 2 weeks and avoid use of ibuprofen or other "NSAIDs" medications

## 2019-12-25 ENCOUNTER — Other Ambulatory Visit: Payer: Self-pay

## 2019-12-26 LAB — H. PYLORI ANTIBODY, IGG: H Pylori IgG: >9.4 Index Value — ABNORMAL HIGH (ref 0.00–0.79)

## 2019-12-29 ENCOUNTER — Ambulatory Visit: Payer: Self-pay | Attending: Internal Medicine

## 2019-12-29 DIAGNOSIS — Z20822 Contact with and (suspected) exposure to covid-19: Secondary | ICD-10-CM | POA: Insufficient documentation

## 2019-12-30 LAB — NOVEL CORONAVIRUS, NAA: SARS-CoV-2, NAA: NOT DETECTED

## 2020-02-16 ENCOUNTER — Ambulatory Visit: Payer: Self-pay | Attending: Internal Medicine

## 2020-02-16 DIAGNOSIS — Z20822 Contact with and (suspected) exposure to covid-19: Secondary | ICD-10-CM | POA: Insufficient documentation

## 2020-02-17 LAB — NOVEL CORONAVIRUS, NAA: SARS-CoV-2, NAA: NOT DETECTED

## 2020-06-08 ENCOUNTER — Other Ambulatory Visit: Payer: Self-pay

## 2020-06-08 ENCOUNTER — Encounter: Payer: Self-pay | Admitting: Emergency Medicine

## 2020-06-08 ENCOUNTER — Emergency Department
Admission: EM | Admit: 2020-06-08 | Discharge: 2020-06-08 | Disposition: A | Discharge status: Home / Self Care | Payer: Self-pay | Attending: Student in an Organized Health Care Education/Training Program | Admitting: Student in an Organized Health Care Education/Training Program

## 2020-06-08 DIAGNOSIS — S61012D Laceration without foreign body of left thumb without damage to nail, subsequent encounter: Secondary | ICD-10-CM | POA: Insufficient documentation

## 2020-06-08 DIAGNOSIS — I10 Essential (primary) hypertension: Secondary | ICD-10-CM | POA: Insufficient documentation

## 2020-06-08 DIAGNOSIS — Y999 Unspecified external cause status: Secondary | ICD-10-CM | POA: Insufficient documentation

## 2020-06-08 DIAGNOSIS — Y93D2 Activity, sewing: Secondary | ICD-10-CM | POA: Insufficient documentation

## 2020-06-08 DIAGNOSIS — W272XXD Contact with scissors, subsequent encounter: Secondary | ICD-10-CM | POA: Insufficient documentation

## 2020-06-08 DIAGNOSIS — F17210 Nicotine dependence, cigarettes, uncomplicated: Secondary | ICD-10-CM | POA: Insufficient documentation

## 2020-06-08 DIAGNOSIS — Y9269 Other specified industrial and construction area as the place of occurrence of the external cause: Secondary | ICD-10-CM | POA: Insufficient documentation

## 2020-06-08 NOTE — Discharge Instructions (Signed)
Follow-up with your regular doctor or orthopedics if any sign of infection.  Return emergency department if worsening. Let the air get to the wound to aid in healing.  You should not wear rubber gloves or soak your hands in water.  You may keep a dry dressing on the area if out in public. No use of the left hand at work for at least 1 week. Continue take your antibiotic.

## 2020-06-08 NOTE — ED Provider Notes (Signed)
[facility] Emergency Department Provider Note  ____________________________________________   First MD Initiated Contact with Patient 06/08/20 1403     (approximate)  I have reviewed the triage vital signs and the nursing notes.   HISTORY  Chief Complaint Finger Injury and Hand Pain    HPI Timothy Proctor is a 24 y.o. male presents emergency department complaining of a old laceration to the left thumb.  States he was seen at [facility] 4 days ago for the same.  States that happened at work when he was cutting fabric and scissors caught his thumb.  He states he was told he could not suture it as the skin was just gone.  They did update his tetanus and ensure he did not have a fracture.  States he is concerned at this time that he has an infection in the finger.  However he was given cefdinir to prevent infection from [facility].    Past Medical History:  Diagnosis Date  . Elevated blood pressure (not hypertension)   . Frequent headaches   . Herpes labialis   . Hypertension   . Migraine     Patient Active Problem List   Diagnosis Date Noted  . Essential hypertension 11/30/2016  . Insomnia 03/20/2016  . Frequent headaches 09/13/2015  . Migraine without aura, intractable 05/19/2015    History reviewed. No pertinent surgical history.  Prior to Admission medications   Medication Sig Start Date End Date Taking? Authorizing Provider  naproxen (NAPROSYN) 500 MG tablet Take 1 tablet (500 mg total) by mouth 2 (two) times daily with a meal. 02/17/19   Jene Every, MD  ondansetron (ZOFRAN ODT) 4 MG disintegrating tablet Take 1 tablet (4 mg total) by mouth every 6 (six) hours as needed. 12/24/19   Sharyn Creamer, MD  sucralfate (CARAFATE) 1 g tablet Take 1 tablet (1 g total) by mouth 4 (four) times daily for 14 days. 12/24/19 01/07/20  Sharyn Creamer, MD  valACYclovir (VALTREX) 1000 MG tablet Take 1 tablet (1,000 mg total) by mouth 2 (two) times daily.  11/12/17   Lorre Munroe, NP  amLODipine (NORVASC) 10 MG tablet Take 1 tablet (10 mg total) by mouth daily. Patient not taking: Reported on 08/01/2017 01/19/17 06/08/20  Doreene Nest, NP  nortriptyline (PAMELOR) 25 MG capsule Take 1-2 capsules (25-50 mg total) by mouth at bedtime. As migraine prevention. Patient not taking: Reported on 08/01/2017 03/27/17 06/08/20  Karie Schwalbe, MD    Allergies Patient has no known allergies.  Family History  Problem Relation Age of Onset  . Hypertension Mother   . Hypertension Father   . Cancer Maternal Grandmother        lung    Social History Social History   Tobacco Use  . Smoking status: Current Every Day Smoker    Types: E-cigarettes  . Smokeless tobacco: Former Neurosurgeon    Quit date: 03/29/2015  Vaping Use  . Vaping Use: Every day  Substance Use Topics  . Alcohol use: Yes    Alcohol/week: 6.0 standard drinks    Types: 6 Cans of beer per week  . Drug use: No    Review of Systems  Constitutional: No fever/chills Eyes: No visual changes. ENT: No sore throat. Respiratory: Denies cough Genitourinary: Negative for dysuria. Musculoskeletal: Negative for back pain.  Concerns of infection to the left thumb Skin: Negative for rash. Psychiatric: no mood changes,     ____________________________________________   PHYSICAL EXAM:  VITAL SIGNS: ED Triage  Vitals  Enc Vitals Group     BP 06/08/20 1338 (!) 154/79     Pulse Rate 06/08/20 1338 71     Resp 06/08/20 1338 20     Temp 06/08/20 1338 98.2 F (36.8 C)     Temp Source 06/08/20 1338 Oral     SpO2 06/08/20 1338 99 %     Weight 06/08/20 1335 187 lb (84.8 kg)     Height 06/08/20 1335 6' (1.829 m)     Head Circumference --      Peak Flow --      Pain Score 06/08/20 1335 7     Pain Loc --      Pain Edu? --      Excl. in GC? --     Constitutional: Alert and oriented. Well appearing and in no acute distress. Eyes: Conjunctivae are normal.  Head: Atraumatic. Nose: No  congestion/rhinnorhea. Mouth/Throat: Mucous membranes are moist.   Neck:  supple no lymphadenopathy noted Cardiovascular: Normal rate, regular rhythm.  Respiratory: Normal respiratory effort.  No retractions,  GU: deferred Musculoskeletal: FROM all extremities, warm and well perfused, skin at the left thumb appears very wet, off-white due to the Vaseline gauze being placed on the thumb for 4 days.  No pus or drainage.  No redness is noted.  No foreign body noted. Neurologic:  Normal speech and language.  Skin:  Skin is warm, dry. No rash noted. Psychiatric: Mood and affect are normal. Speech and behavior are normal.  ____________________________________________   LABS (all labs ordered are listed, but only abnormal results are displayed)  Labs Reviewed - No data to display ____________________________________________   ____________________________________________  RADIOLOGY    ____________________________________________   PROCEDURES  Procedure(s) performed: No  Procedures    ____________________________________________   INITIAL IMPRESSION / ASSESSMENT AND PLAN / ED COURSE  Pertinent labs & imaging results that were available during my care of the patient were reviewed by me and considered in my medical decision making (see chart for details).   Patient is a 24 year old male presents emergency department with concerns of infection to the left thumb.  See HPI.  Physical exam  Consistent with the area being too wet.  Did explain this to the patient.  He is to let the area dry out.  Dry dressing was applied here in the ED.  I will have any concerns for infection.  I do feel that with the type of work he does he should not use his left hand for 1 week.  He was given a work note stating same.  Discharged stable condition.    Timothy Proctor was evaluated in Emergency Department on 06/08/2020 for the symptoms described in the history of present illness. He was evaluated in  the context of the global COVID-19 pandemic, which necessitated consideration that the patient might be at risk for infection with the SARS-CoV-2 virus that causes COVID-19. Institutional protocols and algorithms that pertain to the evaluation of patients at risk for COVID-19 are in a state of rapid change based on information released by regulatory bodies including the CDC and federal and state organizations. These policies and algorithms were followed during the patient's care in the ED.   As part of my medical decision making, I reviewed the following data within the electronic MEDICAL RECORD NUMBER Nursing notes reviewed and incorporated, Old chart reviewed, Notes from prior ED visits and [place] Controlled Substance Database  ____________________________________________   FINAL CLINICAL IMPRESSION(S) / ED DIAGNOSES  Final diagnoses:  Thumb laceration, left, subsequent encounter      NEW MEDICATIONS STARTED DURING THIS VISIT:  Current Discharge Medication List       Note:  This document was prepared using Dragon voice recognition software and may include unintentional dictation errors.    Faythe Ghee, PA-C 06/08/20 1449    Willy Eddy, MD 06/08/20 [phone]

## 2020-06-08 NOTE — ED Triage Notes (Signed)
Pt reports cut a chunk out of his left hand thumb 4 days ago, was seen at [facility] and told they could not stitch it but wrapped it in petroleum gauze and gave him abx. Pt reports thumb is pain and he is concerned for infection.

## 2020-06-08 NOTE — ED Notes (Signed)
Pt's finger dressed with dry dressing non adherent and secured with gauze.

## 2020-06-08 NOTE — ED Notes (Signed)
Pt has cut noted to left thumb from injury at work. No bleeding or pus noted.

## 2021-04-07 ENCOUNTER — Other Ambulatory Visit: Payer: Self-pay

## 2021-04-07 ENCOUNTER — Ambulatory Visit (INDEPENDENT_AMBULATORY_CARE_PROVIDER_SITE_OTHER): Payer: Self-pay

## 2021-04-07 ENCOUNTER — Ambulatory Visit
Admission: EM | Admit: 2021-04-07 | Discharge: 2021-04-07 | Disposition: A | Discharge status: Home / Self Care | Payer: Self-pay | Attending: Family Medicine | Admitting: Family Medicine

## 2021-04-07 ENCOUNTER — Encounter: Payer: Self-pay | Admitting: Emergency Medicine

## 2021-04-07 DIAGNOSIS — R509 Fever, unspecified: Secondary | ICD-10-CM

## 2021-04-07 DIAGNOSIS — R059 Cough, unspecified: Secondary | ICD-10-CM

## 2021-04-07 DIAGNOSIS — J0110 Acute frontal sinusitis, unspecified: Secondary | ICD-10-CM

## 2021-04-07 DIAGNOSIS — R0989 Other specified symptoms and signs involving the circulatory and respiratory systems: Secondary | ICD-10-CM

## 2021-04-07 MED ORDER — AMOXICILLIN-POT CLAVULANATE 875-125 MG PO TABS: 1.0000 | ORAL_TABLET | Freq: Two times a day (BID) | ORAL | 0 refills | Status: AC

## 2021-04-07 NOTE — ED Triage Notes (Signed)
Pt is present with nasal congestion, fever, coughing, ear ache, and jaw pain. Pt states that his sx started Sunday night.

## 2021-04-07 NOTE — ED Provider Notes (Signed)
[facility]    CSN: [id number] Arrival date & time: 04/07/21  1155      History   Chief Complaint Chief Complaint  Patient presents with  . Fever  . Otalgia    Bilateral    HPI  25 year old male presents with the above complaints.  Patient has been sick since Sunday.  His daughter has recently been sick as well.  She was seen here and had negative COVID and flu testing.  He states that he is having congestion, fever, cough, bilateral ear pain.  Fever has been as high as 101.  Pain currently 6/10 in severity.  He localizes most of the pain to the ears into the frontal region.  No relieving factors.  No other complaints.  Past Medical History:  Diagnosis Date  . Elevated blood pressure (not hypertension)   . Frequent headaches   . Herpes labialis   . Hypertension   . Migraine     Patient Active Problem List   Diagnosis Date Noted  . Essential hypertension 11/30/2016  . Insomnia 03/20/2016  . Frequent headaches 09/13/2015  . Migraine without aura, intractable 05/19/2015    History reviewed. No pertinent surgical history.     Home Medications    Prior to Admission medications   Medication Sig Start Date End Date Taking? Authorizing Provider  amoxicillin-clavulanate (AUGMENTIN) 875-125 MG tablet Take 1 tablet by mouth 2 (two) times daily. 04/07/21  Yes Georgene Kopper G, DO  sucralfate (CARAFATE) 1 g tablet Take 1 tablet (1 g total) by mouth 4 (four) times daily for 14 days. 12/24/19 01/07/20  Sharyn Creamer, MD  amLODipine (NORVASC) 10 MG tablet Take 1 tablet (10 mg total) by mouth daily. Patient not taking: Reported on 08/01/2017 01/19/17 06/08/20  Doreene Nest, NP  nortriptyline (PAMELOR) 25 MG capsule Take 1-2 capsules (25-50 mg total) by mouth at bedtime. As migraine prevention. Patient not taking: Reported on 08/01/2017 03/27/17 06/08/20  Karie Schwalbe, MD    Family History Family History  Problem Relation Age of Onset  . Hypertension Mother   .  Hypertension Father   . Cancer Maternal Grandmother        lung    Social History Social History   Tobacco Use  . Smoking status: Current Every Day Smoker    Types: E-cigarettes  . Smokeless tobacco: Former Neurosurgeon    Quit date: 03/29/2015  Vaping Use  . Vaping Use: Every day  Substance Use Topics  . Alcohol use: Yes    Alcohol/week: 6.0 standard drinks    Types: 6 Cans of beer per week  . Drug use: No     Allergies   Patient has no known allergies.   Review of Systems Review of Systems Per HPI  Physical Exam Triage Vital Signs ED Triage Vitals  Enc Vitals Group     BP 04/07/21 1249 (!) 132/98     Pulse Rate 04/07/21 1249 75     Resp 04/07/21 1249 17     Temp 04/07/21 1249 98.1 F (36.7 C)     Temp Source 04/07/21 1249 Oral     SpO2 04/07/21 1249 100 %     Weight --      Height --      Head Circumference --      Peak Flow --      Pain Score 04/07/21 1247 6     Pain Loc --      Pain Edu? --  Excl. in GC? --    Updated Vital Signs BP (!) 132/98 (BP Location: Left Arm)   Pulse 75   Temp 98.1 F (36.7 C) (Oral)   Resp 17   SpO2 100%   Visual Acuity Right Eye Distance:   Left Eye Distance:   Bilateral Distance:    Right Eye Near:   Left Eye Near:    Bilateral Near:     Physical Exam Constitutional:      General: He is not in acute distress.    Appearance: Normal appearance. He is not ill-appearing.  HENT:     Head: Normocephalic and atraumatic.     Comments: Mild tenderness over the frontal sinuses.    Right Ear: Tympanic membrane normal.     Left Ear: Tympanic membrane normal.     Mouth/Throat:     Pharynx: Oropharynx is clear.  Eyes:     General:        Right eye: No discharge.        Left eye: No discharge.     Conjunctiva/sclera: Conjunctivae normal.  Cardiovascular:     Rate and Rhythm: Normal rate and regular rhythm.     Heart sounds: No murmur heard.   Pulmonary:     Effort: Pulmonary effort is normal.     Comments: Left  basilar crackles noted. Neurological:     Mental Status: He is alert.  Psychiatric:        Mood and Affect: Mood normal.        Behavior: Behavior normal.    UC Treatments / Results  Labs (all labs ordered are listed, but only abnormal results are displayed) Labs Reviewed - No data to display  EKG   Radiology DG Chest 2 View  Result Date: 04/07/2021 CLINICAL DATA:  Cough, fever and congestion. EXAM: CHEST - 2 VIEW COMPARISON:  12/24/2019 FINDINGS: Heart and mediastinal shadows are normal. The lungs are clear. No effusions. Thoracic scoliotic curvature convex to the right as seen previously. IMPRESSION: No active cardiopulmonary disease. Scoliosis. Electronically Signed   By: Paulina Fusi M.D.   On: 04/07/2021 13:54    Procedures Procedures (including critical care time)  Medications Ordered in UC Medications - No data to display  Initial Impression / Assessment and Plan / UC Course  I have reviewed the triage vital signs and the nursing notes.  Pertinent labs & imaging results that were available during my care of the patient were reviewed by me and considered in my medical decision making (see chart for details).    25 year old male presents with respiratory symptoms.  Given his lung findings, chest x-ray was obtained.  Chest x-ray independently reviewed by me.  Interpretation: Normal chest x-ray.  No evidence of pneumonia.  Likely has acute sinusitis.  Treating with Augmentin.  Final Clinical Impressions(s) / UC Diagnoses   Final diagnoses:  Acute frontal sinusitis, recurrence not specified   Discharge Instructions   None    ED Prescriptions    Medication Sig Dispense Auth. Provider   amoxicillin-clavulanate (AUGMENTIN) 875-125 MG tablet Take 1 tablet by mouth 2 (two) times daily. 20 tablet Tommie Sams, DO     PDMP not reviewed this encounter.   Tommie Sams, [place] 04/07/21 1418

## 2023-12-31 ENCOUNTER — Ambulatory Visit
Admission: EM | Admit: 2023-12-31 | Discharge: 2023-12-31 | Disposition: A | Discharge status: Home / Self Care | Payer: Self-pay | Attending: Emergency Medicine | Admitting: Emergency Medicine

## 2023-12-31 DIAGNOSIS — R519 Headache, unspecified: Secondary | ICD-10-CM

## 2023-12-31 DIAGNOSIS — R42 Dizziness and giddiness: Secondary | ICD-10-CM

## 2023-12-31 DIAGNOSIS — R112 Nausea with vomiting, unspecified: Secondary | ICD-10-CM

## 2023-12-31 DIAGNOSIS — B349 Viral infection, unspecified: Secondary | ICD-10-CM

## 2023-12-31 LAB — POC COVID19/FLU A&B COMBO
Covid Antigen, POC: NEGATIVE
Influenza A Antigen, POC: NEGATIVE
Influenza B Antigen, POC: NEGATIVE

## 2023-12-31 MED ORDER — ONDANSETRON 4 MG PO TBDP: 4.0000 mg | ORAL_TABLET | Freq: Three times a day (TID) | ORAL | 0 refills | Status: DC | PRN

## 2023-12-31 MED ORDER — ONDANSETRON 4 MG PO TBDP
4.0000 mg | ORAL_TABLET | Freq: Once | ORAL | Status: AC
  Administered 2023-12-31: 4 mg via ORAL

## 2023-12-31 MED ORDER — KETOROLAC TROMETHAMINE 30 MG/ML IJ SOLN
30.0000 mg | Freq: Once | INTRAMUSCULAR | Status: AC
  Administered 2023-12-31: 30 mg via INTRAMUSCULAR

## 2023-12-31 NOTE — ED Provider Notes (Signed)
[facility]    CSN: [id number] Arrival date & time: 12/31/23  1400      History   Chief Complaint Chief Complaint  Patient presents with   Headache    HPI Timothy Proctor is a 28 y.o. male.  Patient presents with 3 day history of headache which he feels like is a migraine.  He has associated nausea and vomiting.  Last emesis yesterday; none today.  No OTC medications today.  Patient also reports 3-day history of congestion, runny nose, sore throat, cough.  He feels lightheaded in the mornings when he gets up and at night when he lies down.  No shortness of breath or diarrhea.  The history is provided by the patient and medical records.    Past Medical History:  Diagnosis Date   Elevated blood pressure (not hypertension)    Frequent headaches    Herpes labialis    Hypertension    Migraine     Patient Active Problem List   Diagnosis Date Noted   Essential hypertension 11/30/2016   Insomnia 03/20/2016   Frequent headaches 09/13/2015   Migraine without aura, intractable 05/19/2015    History reviewed. No pertinent surgical history.     Home Medications    Prior to Admission medications   Medication Sig Start Date End Date Taking? Authorizing Provider  ondansetron (ZOFRAN-ODT) 4 MG disintegrating tablet Take 1 tablet (4 mg total) by mouth every 8 (eight) hours as needed for nausea or vomiting. 12/31/23  Yes Mickie Bail, NP  amoxicillin-clavulanate (AUGMENTIN) 875-125 MG tablet Take 1 tablet by mouth 2 (two) times daily. Patient not taking: Reported on 12/31/2023 04/07/21   Tommie Sams, DO  sucralfate (CARAFATE) 1 g tablet Take 1 tablet (1 g total) by mouth 4 (four) times daily for 14 days. Patient not taking: Reported on 12/31/2023 12/24/19 01/07/20  Sharyn Creamer, MD  amLODipine (NORVASC) 10 MG tablet Take 1 tablet (10 mg total) by mouth daily. Patient not taking: Reported on 08/01/2017 01/19/17 06/08/20  Doreene Nest, NP  nortriptyline (PAMELOR) 25 MG  capsule Take 1-2 capsules (25-50 mg total) by mouth at bedtime. As migraine prevention. Patient not taking: Reported on 08/01/2017 03/27/17 06/08/20  Karie Schwalbe, MD    Family History Family History  Problem Relation Age of Onset   Hypertension Mother    Hypertension Father    Cancer Maternal Grandmother        lung    Social History Social History   Tobacco Use   Smoking status: Every Day    Types: E-cigarettes   Smokeless tobacco: Former    Quit date: 03/29/2015  Vaping Use   Vaping status: Every Day  Substance Use Topics   Alcohol use: Yes    Alcohol/week: 6.0 standard drinks of alcohol    Types: 6 Cans of beer per week   Drug use: No     Allergies   Patient has no known allergies.   Review of Systems Review of Systems  Constitutional:  Negative for chills and fever.  HENT:  Positive for congestion, rhinorrhea, sneezing and sore throat.   Respiratory:  Positive for cough. Negative for shortness of breath.   Cardiovascular:  Negative for chest pain and palpitations.  Gastrointestinal:  Positive for nausea and vomiting. Negative for abdominal pain and diarrhea.  Neurological:  Positive for light-headedness and headaches. Negative for syncope, weakness and numbness.     Physical Exam Triage Vital Signs ED Triage Vitals  Encounter Vitals Group  BP      Systolic BP Percentile      Diastolic BP Percentile      Pulse      Resp      Temp      Temp src      SpO2      Weight      Height      Head Circumference      Peak Flow      Pain Score      Pain Loc      Pain Education      Exclude from Growth Chart    No data found.  Updated Vital Signs BP 139/82   Pulse 74   Temp 98 F (36.7 C)   Resp 18   SpO2 97%   Visual Acuity Right Eye Distance:   Left Eye Distance:   Bilateral Distance:    Right Eye Near:   Left Eye Near:    Bilateral Near:     Physical Exam Constitutional:      General: He is not in acute distress. HENT:     Right  Ear: Tympanic membrane normal.     Left Ear: Tympanic membrane normal.     Nose: Rhinorrhea present.     Mouth/Throat:     Mouth: Mucous membranes are moist.     Pharynx: Oropharynx is clear.  Cardiovascular:     Rate and Rhythm: Normal rate and regular rhythm.     Heart sounds: Normal heart sounds.  Pulmonary:     Effort: Pulmonary effort is normal. No respiratory distress.     Breath sounds: Normal breath sounds.  Abdominal:     General: Bowel sounds are normal.     Palpations: Abdomen is soft.     Tenderness: There is no abdominal tenderness.  Skin:    General: Skin is warm and dry.  Neurological:     General: No focal deficit present.     Mental Status: He is alert and oriented to person, place, and time.     Sensory: No sensory deficit.     Motor: No weakness.     Gait: Gait normal.      UC Treatments / Results  Labs (all labs ordered are listed, but only abnormal results are displayed) Labs Reviewed  POC COVID19/FLU A&B COMBO    EKG   Radiology No results found.  Procedures Procedures (including critical care time)  Medications Ordered in UC Medications  ketorolac (TORADOL) 30 MG/ML injection 30 mg (30 mg Intramuscular Given 12/31/23 1619)  ondansetron (ZOFRAN-ODT) disintegrating tablet 4 mg (4 mg Oral Given 12/31/23 1619)    Initial Impression / Assessment and Plan / UC Course  I have reviewed the triage vital signs and the nursing notes.  Pertinent labs & imaging results that were available during my care of the patient were reviewed by me and considered in my medical decision making (see chart for details).    Headache, Nausea and vomiting, Viral illness, Dizziness.  Afebrile, VSS.  No OTC medications taken today.  Rapid flu and COVID negative.  Toradol and Zofran given here.  Discharging home with prescription for Zofran.  Instructed patient to avoid OTC medications today.  Discussed maintaining oral hydration at home.  ED precautions discussed.   Education provided on migraine, nausea and vomiting, viral illness, dizziness.  Instructed patient to follow-up with his PCP.  He agrees to plan of care.   Final Clinical Impressions(s) / UC Diagnoses   Final diagnoses:  Acute nonintractable headache, unspecified headache type  Nausea and vomiting, unspecified vomiting type  Viral illness  Dizziness     Discharge Instructions      You were given Toradol and Zofran for your headache and nausea.    Do not take any over-the-counter medications today.    The COVID and flu tests are negative.   Take the antinausea medication as directed.  Keep yourself hydrated with clear liquids, such as water.    Go to the emergency department if you have worsening symptoms.   Follow up with your primary care provider.          ED Prescriptions     Medication Sig Dispense Auth. Provider   ondansetron (ZOFRAN-ODT) 4 MG disintegrating tablet Take 1 tablet (4 mg total) by mouth every 8 (eight) hours as needed for nausea or vomiting. 20 tablet Mickie Bail, NP      PDMP not reviewed this encounter.   Mickie Bail, NP 12/31/23 1620

## 2023-12-31 NOTE — ED Triage Notes (Addendum)
Patient to Urgent Care with complaints of migraine headache  Reports headache started yesterday. Has tried ibuprofen/ robitussin for cold symptoms w/ little relief. No otc meds today. Reports symptoms started with a sore throat and tonsil swelling .  Using Mucinex cough drops- no longer having a sore throat but tonsils still swollen. Three days of symptoms.

## 2023-12-31 NOTE — Discharge Instructions (Addendum)
You were given Toradol and Zofran for your headache and nausea.    Do not take any over-the-counter medications today.    The COVID and flu tests are negative.   Take the antinausea medication as directed.  Keep yourself hydrated with clear liquids, such as water.    Go to the emergency department if you have worsening symptoms.   Follow up with your primary care provider.

## 2024-06-15 ENCOUNTER — Other Ambulatory Visit: Payer: Self-pay

## 2024-06-15 ENCOUNTER — Emergency Department
Admission: EM | Admit: 2024-06-15 | Discharge: 2024-06-15 | Disposition: A | Discharge status: Home / Self Care | Payer: MEDICAID | Attending: Emergency Medicine | Admitting: Emergency Medicine

## 2024-06-15 ENCOUNTER — Emergency Department: Payer: MEDICAID

## 2024-06-15 DIAGNOSIS — R1013 Epigastric pain: Secondary | ICD-10-CM | POA: Insufficient documentation

## 2024-06-15 DIAGNOSIS — F17290 Nicotine dependence, other tobacco product, uncomplicated: Secondary | ICD-10-CM | POA: Insufficient documentation

## 2024-06-15 DIAGNOSIS — R112 Nausea with vomiting, unspecified: Secondary | ICD-10-CM | POA: Diagnosis present

## 2024-06-15 DIAGNOSIS — R197 Diarrhea, unspecified: Secondary | ICD-10-CM | POA: Diagnosis not present

## 2024-06-15 LAB — COMPREHENSIVE METABOLIC PANEL WITH GFR
ALT: 16 U/L (ref 0–44)
AST: 17 U/L (ref 15–41)
Albumin: 4.6 g/dL (ref 3.5–5.0)
Alkaline Phosphatase: 66 U/L (ref 38–126)
Anion gap: 16 — ABNORMAL HIGH (ref 5–15)
BUN: 8 mg/dL (ref 6–20)
CO2: 23 mmol/L (ref 22–32)
Calcium: 9.8 mg/dL (ref 8.9–10.3)
Chloride: 98 mmol/L (ref 98–111)
Creatinine, Ser: 1.04 mg/dL (ref 0.61–1.24)
GFR, Estimated: >60 mL/min (ref >60)
Glucose, Bld: 118 mg/dL — ABNORMAL HIGH (ref 70–99)
Potassium: 3.8 mmol/L (ref 3.5–5.1)
Sodium: 137 mmol/L (ref 135–145)
Total Bilirubin: 2.5 mg/dL — ABNORMAL HIGH (ref 0.0–1.2)
Total Protein: 7.6 g/dL (ref 6.5–8.1)

## 2024-06-15 LAB — CBC
HCT: 46.7 % (ref 39.0–52.0)
Hemoglobin: 16.7 g/dL (ref 13.0–17.0)
MCH: 31 pg (ref 26.0–34.0)
MCHC: 35.8 g/dL (ref 30.0–36.0)
MCV: 86.6 fL (ref 80.0–100.0)
Platelets: 249 K/uL (ref 150–400)
RBC: 5.39 MIL/uL (ref 4.22–5.81)
RDW: 11.9 % (ref 11.5–15.5)
WBC: 6.5 K/uL (ref 4.0–10.5)
nRBC: 0 % (ref 0.0–0.2)

## 2024-06-15 LAB — URINALYSIS, ROUTINE W REFLEX MICROSCOPIC
Bacteria, UA: NONE SEEN
Bilirubin Urine: NEGATIVE
Glucose, UA: NEGATIVE mg/dL
Hgb urine dipstick: NEGATIVE
Ketones, ur: 20 mg/dL — AB
Leukocytes,Ua: NEGATIVE
Nitrite: NEGATIVE
Protein, ur: 30 mg/dL — AB
Specific Gravity, Urine: 1.026 (ref 1.005–1.030)
pH: 5 (ref 5.0–8.0)

## 2024-06-15 LAB — LIPASE, BLOOD: Lipase: 26 U/L (ref 11–51)

## 2024-06-15 MED ORDER — ONDANSETRON 4 MG PO TBDP: 4.0000 mg | ORAL_TABLET | Freq: Three times a day (TID) | ORAL | 0 refills | Status: AC | PRN

## 2024-06-15 MED ORDER — DROPERIDOL 2.5 MG/ML IJ SOLN
1.2500 mg | Freq: Once | INTRAMUSCULAR | Status: AC
  Administered 2024-06-15: 1.25 mg via INTRAVENOUS
  Filled 2024-06-15: qty 2

## 2024-06-15 MED ORDER — ONDANSETRON HCL 4 MG/2ML IJ SOLN
4.0000 mg | Freq: Once | INTRAMUSCULAR | Status: AC
  Administered 2024-06-15: 4 mg via INTRAVENOUS
  Filled 2024-06-15: qty 2

## 2024-06-15 MED ORDER — PANTOPRAZOLE SODIUM 40 MG IV SOLR
40.0000 mg | Freq: Once | INTRAVENOUS | Status: AC
  Administered 2024-06-15: 40 mg via INTRAVENOUS
  Filled 2024-06-15: qty 10

## 2024-06-15 MED ORDER — OMEPRAZOLE MAGNESIUM 20 MG PO TBEC: 20.0000 mg | DELAYED_RELEASE_TABLET | Freq: Every day | ORAL | 2 refills | Status: AC

## 2024-06-15 MED ORDER — SODIUM CHLORIDE 0.9 % IV BOLUS
1000.0000 mL | Freq: Once | INTRAVENOUS | Status: AC
  Administered 2024-06-15: 1000 mL via INTRAVENOUS

## 2024-06-15 NOTE — ED Notes (Signed)
 Pt stated he feels better. PO challenge did well. No issues.

## 2024-06-15 NOTE — ED Provider Notes (Signed)
 [facility] Provider Note    Event Date/Time   First MD Initiated Contact with Patient 06/15/24 1113     (approximate)   History   Abdominal Pain   HPI  Timothy Proctor is a 28 y.o. male no significant past medical history presents to the emergency department for not feeling well.  States that on Friday he started having nausea and vomiting.  States that he has been having blood in his vomit that is a dark brown color.  Also states that he has had intermittent episodes of diarrhea and that his diarrhea has been dark-colored.  Denies fever or chills.  No abdominal pain other than when vomiting.  Denies any significant alcohol use.  Does vape nicotine.  Denies any significant or recent marijuana use.  No Goody powder or NSAIDs.  No prior endoscopy.  Does not follow with a primary care provider.     Physical Exam   Triage Vital Signs: ED Triage Vitals [06/15/24 1052]  Encounter Vitals Group     BP (!) 137/97     Girls Systolic BP Percentile      Girls Diastolic BP Percentile      Boys Systolic BP Percentile      Boys Diastolic BP Percentile      Pulse Rate 84     Resp 18     Temp 98.1 F (36.7 C)     Temp Source Oral     SpO2 100 %     Weight 200 lb (90.7 kg)     Height 6' (1.829 m)     Head Circumference      Peak Flow      Pain Score      Pain Loc      Pain Education      Exclude from Growth Chart     Most recent vital signs: Vitals:   06/15/24 1052  BP: (!) 137/97  Pulse: 84  Resp: 18  Temp: 98.1 F (36.7 C)  SpO2: 100%    Physical Exam Constitutional:      Appearance: He is well-developed.  HENT:     Head: Atraumatic.  Eyes:     Conjunctiva/sclera: Conjunctivae normal.  Cardiovascular:     Rate and Rhythm: Regular rhythm.  Pulmonary:     Effort: No respiratory distress.  Abdominal:     General: Abdomen is flat.     Palpations: Abdomen is soft.     Tenderness: There is no abdominal tenderness. There is no right CVA  tenderness, left CVA tenderness, guarding or rebound. Negative signs include Murphy's sign and Rovsing's sign.  Musculoskeletal:     Cervical back: Normal range of motion.  Skin:    General: Skin is warm.  Neurological:     Mental Status: He is alert. Mental status is at baseline.     IMPRESSION / MDM / ASSESSMENT AND PLAN / ED COURSE  I reviewed the triage vital signs and the nursing notes.  Differential diagnosis including viral gastroenteritis, gastritis/PUD, cyclical vomiting, symptomatic cholelithiasis.  Have low suspicion for small bowel obstruction, abdomen is currently nontender to palpation.  Plan for IV fluids, antiemetics and IV Protonix   EKG  EKG with significant artifact.  No significant ST elevation or depression.  No findings of acute ischemia.  Normal QTc.  No tachycardic or bradycardic dysrhythmias while on cardiac telemetry.   LABS (all labs ordered are listed, but only abnormal results are displayed) Labs interpreted as -    Labs  Reviewed  COMPREHENSIVE METABOLIC PANEL WITH GFR - Abnormal; Notable for the following components:      Result Value   Glucose, Bld 118 (*)    Total Bilirubin 2.5 (*)    Anion gap 16 (*)    All other components within normal limits  URINALYSIS, ROUTINE W REFLEX MICROSCOPIC - Abnormal; Notable for the following components:   Color, Urine YELLOW (*)    APPearance HAZY (*)    Ketones, ur 20 (*)    Protein, ur 30 (*)    All other components within normal limits  LIPASE, BLOOD  CBC     MDM Lab work with no leukocytosis.  No significant anemia and hemoglobin is stable at 16.7.  Normal platelets.  UA with no signs of a urinary tract infection no blood in the urine have a low suspicion for kidney stone.  Did have ketones and appears mildly dehydrated  Clinical Course as of 06/15/24 1347  Sun Jun 15, 2024  1208 Continue have significant nausea and vomiting after Zofran .  Will give IV droperidol .  T. bili is elevated at 2.5.  Also  has an elevated anion gap likely in the setting of dehydration given that he has got ketones in his urine.  Normal glucose and no concern for DKA. [SM]  1324 On reevaluation patient states he is feeling much better.  On review of ultrasound I do not see any signs of acute cholecystitis or gallstones.  Normal common bile duct no concern for choledocholithiasis.  Elevated T. bili likely acute inflammatory marker in the setting of acute gastritis.  If able to tolerate p.o. will discharge home with prescription for PPI and antiemetics.  Given a referral for primary care physician.  Discussed smoking cessation.  Encouraged to return to the emergency department if he had any return of symptoms. [SM]    Clinical Course User Index [SM] Suzanne Kirsch, MD     PROCEDURES:  Critical Care performed: No  Procedures  Patient's presentation is most consistent with acute presentation with potential threat to life or bodily function.   MEDICATIONS ORDERED IN ED: Medications  ondansetron  (ZOFRAN ) injection 4 mg (4 mg Intravenous Given 06/15/24 1136)  pantoprazole  (PROTONIX ) injection 40 mg (40 mg Intravenous Given 06/15/24 1137)  sodium chloride  0.9 % bolus 1,000 mL (1,000 mLs Intravenous New Bag/Given 06/15/24 1134)  droperidol  (INAPSINE ) 2.5 MG/ML injection 1.25 mg (1.25 mg Intravenous Given 06/15/24 1152)    FINAL CLINICAL IMPRESSION(S) / ED DIAGNOSES   Final diagnoses:  Epigastric pain  Nausea and vomiting, unspecified vomiting type     Rx / DC Orders   ED Discharge Orders          Ordered    ondansetron  (ZOFRAN -ODT) 4 MG disintegrating tablet  Every 8 hours PRN        06/15/24 1130    omeprazole  (PRILOSEC  OTC) 20 MG tablet  Daily        06/15/24 1130    Ambulatory Referral to Primary Care (Establish Care)        06/15/24 1130             Note:  This document was prepared using Dragon voice recognition software and may include unintentional dictation errors.   Suzanne Kirsch,  MD 06/15/24 1347

## 2024-06-15 NOTE — Discharge Instructions (Addendum)
 You are seen in the emergency department for nausea and vomiting and upper abdominal pain.  Your hemoglobin level was normal.  You were started on an acid reducing medication.  You are also given a prescription for nausea medication.  Stay hydrated and drink plenty of fluids.  Return to the emergency department if you have any worsening symptoms.  Stop smoking nicotine.  You are given information to follow-up with her primary care physician and a referral, it is importantly follow-up.    zofran  (ondansetron ) - nausea medication, take 1 tablet every 8 hours as needed for nausea/vomiting.

## 2024-06-15 NOTE — ED Triage Notes (Signed)
 Pt comes with c/o vomiting blood, belly pain and dark stools. Pt states this started on Friday night.

## 2024-08-22 DIAGNOSIS — Z419 Encounter for procedure for purposes other than remedying health state, unspecified: Secondary | ICD-10-CM | POA: Diagnosis not present
